# Patient Record
Sex: Female | Born: 1960 | Race: White | Hispanic: No | Marital: Married | Smoking: Never smoker
Health system: Southern US, Community
[De-identification: ages and names within clinical notes are randomized; demographics above are authoritative.]

## PROBLEM LIST (undated history)

## (undated) DIAGNOSIS — F429 Obsessive-compulsive disorder, unspecified: Secondary | ICD-10-CM

## (undated) DIAGNOSIS — D496 Neoplasm of unspecified behavior of brain: Secondary | ICD-10-CM

## (undated) DIAGNOSIS — G40909 Epilepsy, unspecified, not intractable, without status epilepticus: Secondary | ICD-10-CM

## (undated) HISTORY — DX: Epilepsy, unspecified, not intractable, without status epilepticus: G40.909

## (undated) HISTORY — PX: COSMETIC SURGERY: SHX468

## (undated) HISTORY — DX: Obsessive-compulsive disorder, unspecified: F42.9

---

## 1996-11-05 HISTORY — PX: STEREOTACTIC BRAIN BIOPSY: SUR134

## 2004-08-05 ENCOUNTER — Ambulatory Visit: Payer: Self-pay | Admitting: Internal Medicine

## 2004-09-05 ENCOUNTER — Ambulatory Visit: Payer: Self-pay | Admitting: Internal Medicine

## 2004-10-05 ENCOUNTER — Ambulatory Visit: Payer: Self-pay | Admitting: Internal Medicine

## 2004-11-05 ENCOUNTER — Ambulatory Visit: Payer: Self-pay | Admitting: Internal Medicine

## 2004-12-06 ENCOUNTER — Ambulatory Visit: Payer: Self-pay | Admitting: Internal Medicine

## 2005-10-24 ENCOUNTER — Emergency Department: Payer: Self-pay | Admitting: Emergency Medicine

## 2007-08-05 ENCOUNTER — Ambulatory Visit: Payer: Self-pay | Admitting: Specialist

## 2008-10-14 ENCOUNTER — Ambulatory Visit: Payer: Self-pay | Admitting: Obstetrics and Gynecology

## 2009-04-03 IMAGING — CT CT ABDOMEN AND PELVIS WITHOUT AND WITH CONTRAST
2 of 4 series · 14 of 32 positions shown, 19 images · non-contrast
Comparison: none

REASON FOR EXAM: chronic cystitis
COMMENTS:

[Series 4: with · axial · 0.58mm/px · z∈[-524,-194]mm · 8 of 86 slices shown, 13 images]
[im 10/86  soft-tissue]
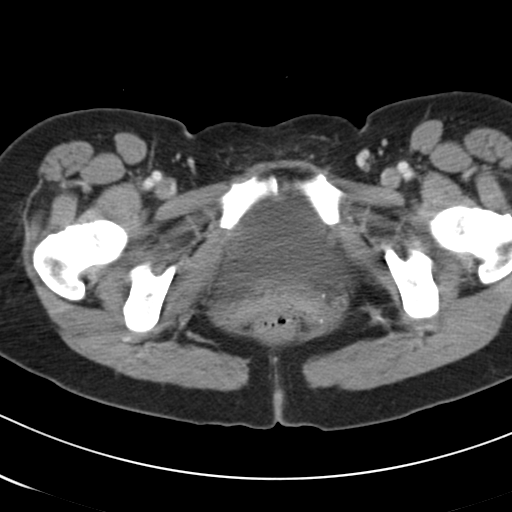
[im 10/86  bone]
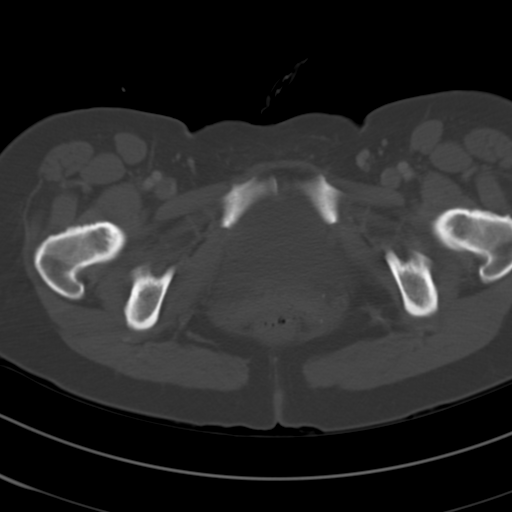
[im 19/86  soft-tissue]
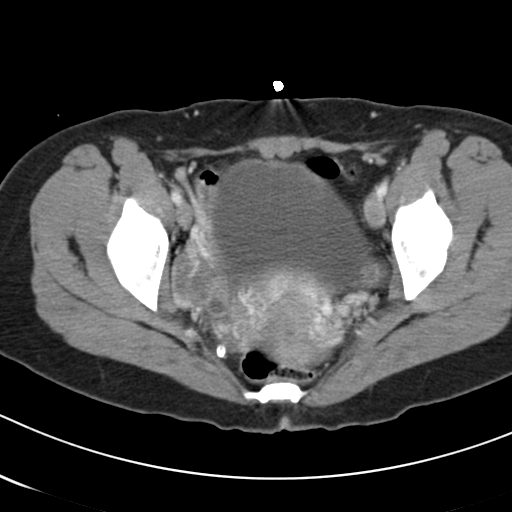
[im 29/86  soft-tissue]
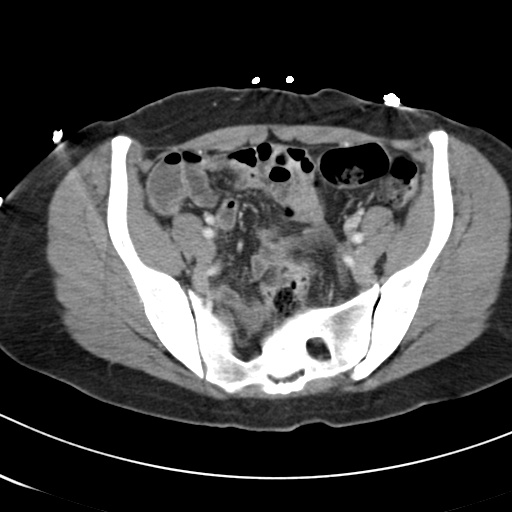
[im 38/86  soft-tissue]
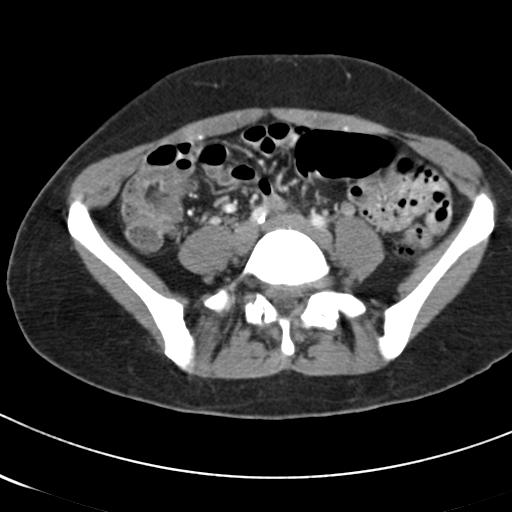
[im 48/86  soft-tissue]
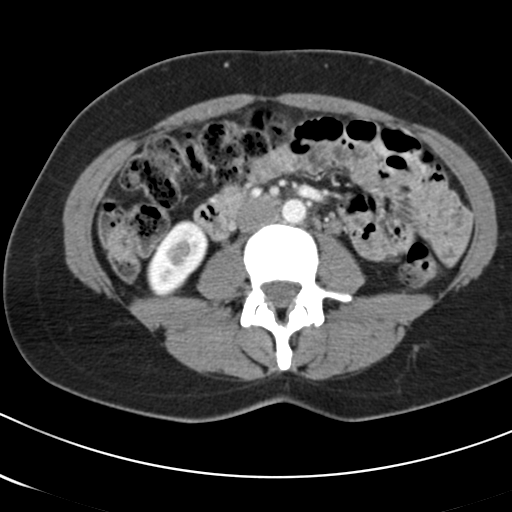
[im 48/86  lung]
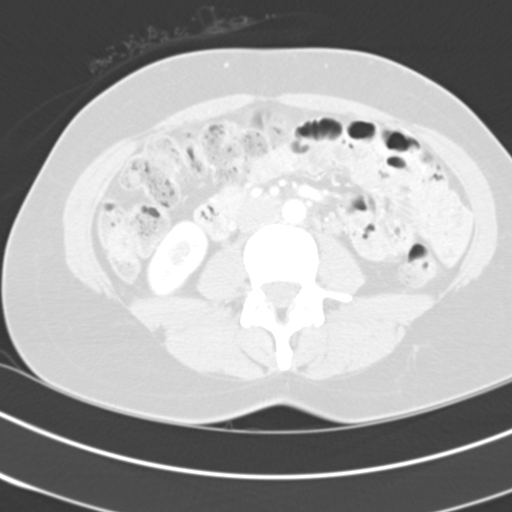
[im 57/86  soft-tissue]
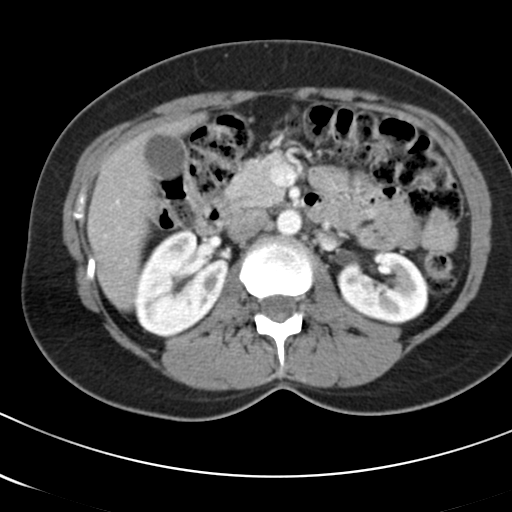
[im 57/86  lung]
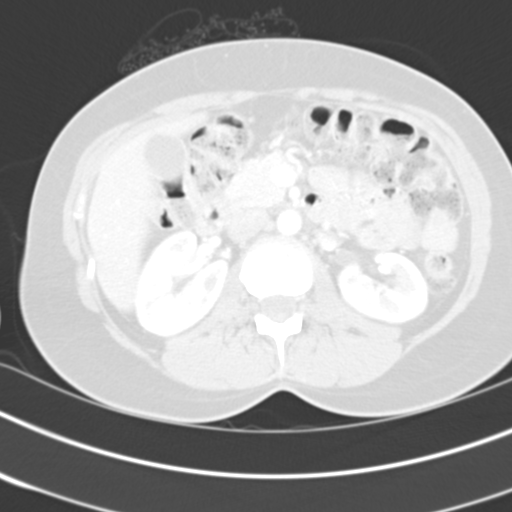
[im 67/86  soft-tissue]
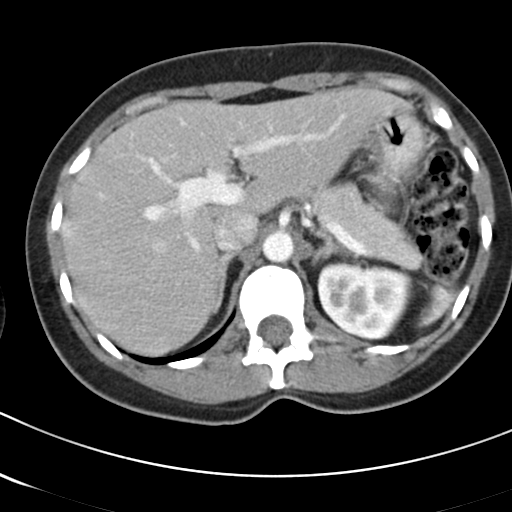
[im 67/86  lung]
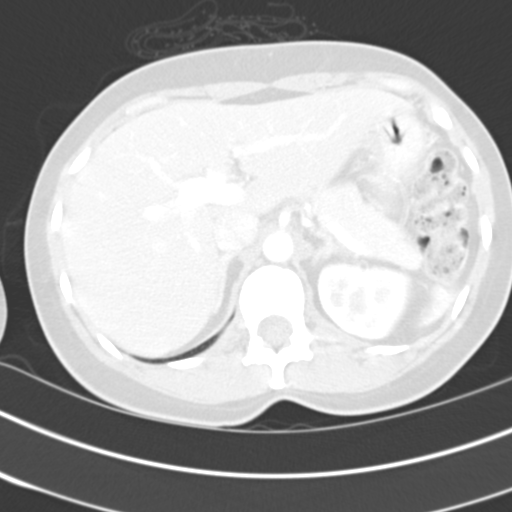
[im 76/86  soft-tissue]
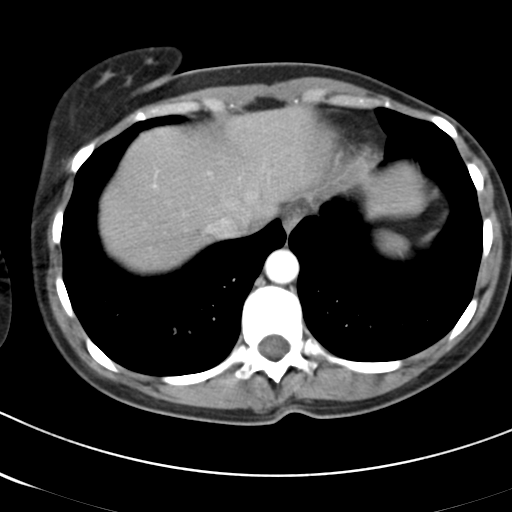
[im 76/86  lung]
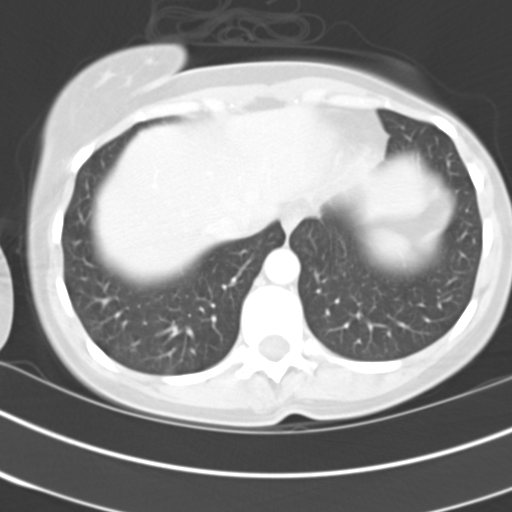

[Series 6: delay · axial · delayed · 0.58mm/px · z∈[-526,-290]mm · 6 of 86 slices shown]
[im 10/86  soft-tissue]
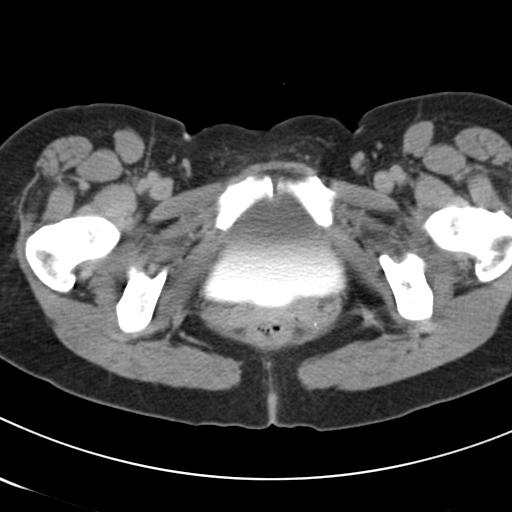
[im 19/86  soft-tissue]
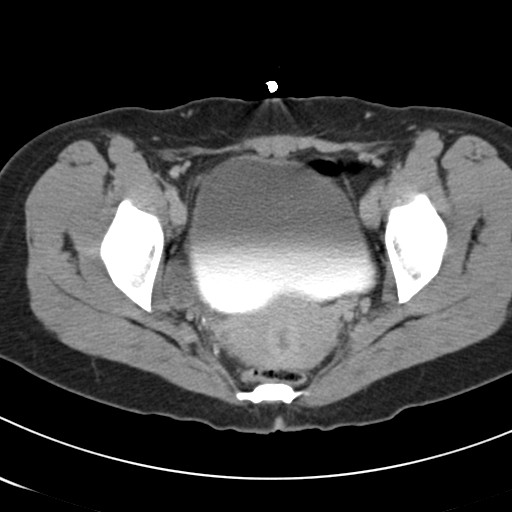
[im 29/86  soft-tissue]
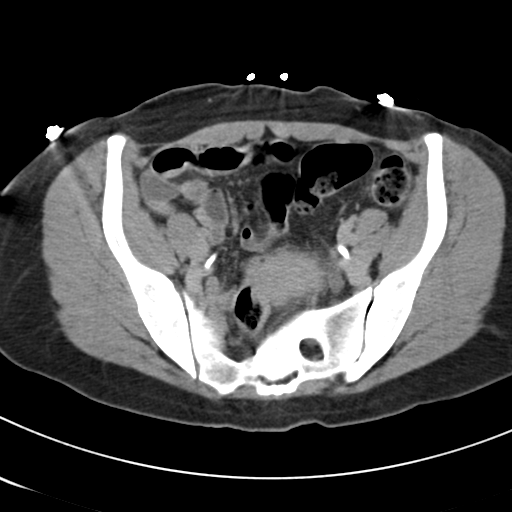
[im 38/86  soft-tissue]
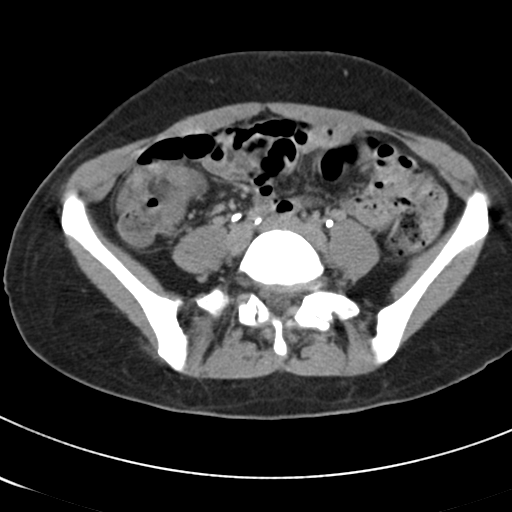
[im 48/86  soft-tissue]
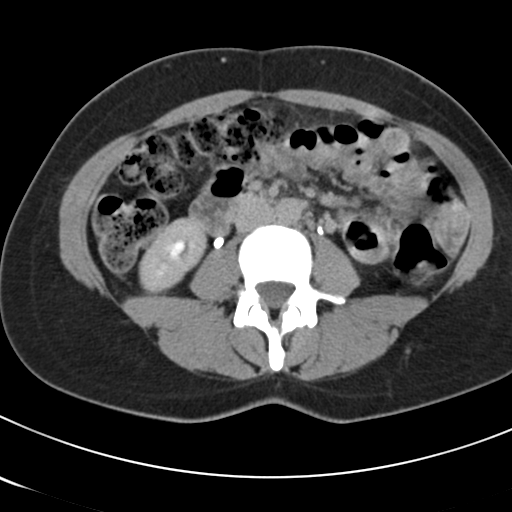
[im 57/86  soft-tissue]
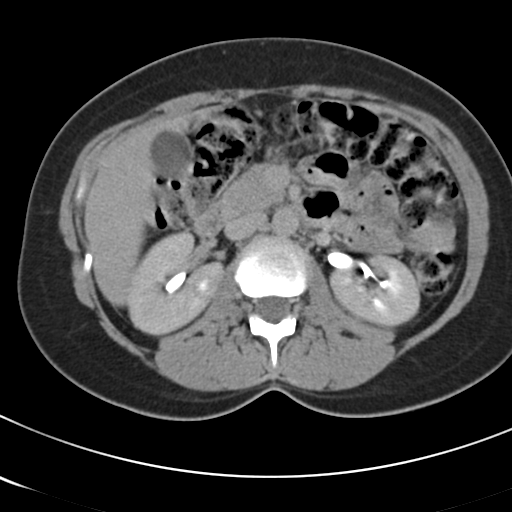

[14 of 32 positions shown; findings below may reference images not displayed]

PROCEDURE:     CT  - CT ABDOMEN / PELVIS  W/WO  - August 05, 2007  [DATE]

RESULT:     Helical 5 mm sections were obtained from the lung bases through
the superior iliac crest pre, immediate and delay intravenous administration
of 100 ml of [id number].

Evaluation of the lung bases demonstrates no gross abnormalities. Evaluation
of the RIGHT and LEFT kidneys demonstrates no evidence of renal masses or
hydronephrosis. There appears to be small bilateral extrarenal pelves. The
liver, spleen, adrenals and pancreas are unremarkable. The portal vein, SMV,
celiac, SMA are opacified. There is no evidence of an abdominal aortic
aneurysm. No evidence of abdominal or pelvic free fluid, drainable loculated
fluid collections, masses or adenopathy is appreciated. Incidental note is
made of a metallic density within the endometrial canal likely representing
an intrauterine device. The urinary bladder is distended with urine and
partially distended with contrast. The urinary bladder is grossly
unremarkable.
IMPRESSION: 1)No CT evidence of abdominal or pelvic masses, free fluid or drainable
loculated fluid collections.  Bilateral mild extrarenal pelves, otherwise,
no further evidence of renal abnormalities.

## 2009-11-02 ENCOUNTER — Ambulatory Visit: Payer: Self-pay | Admitting: Obstetrics and Gynecology

## 2010-11-27 ENCOUNTER — Ambulatory Visit: Payer: Self-pay | Admitting: Obstetrics and Gynecology

## 2011-11-20 LAB — HM PAP SMEAR: HM Pap smear: NORMAL

## 2012-02-05 ENCOUNTER — Ambulatory Visit: Payer: Self-pay | Admitting: Obstetrics and Gynecology

## 2012-02-07 ENCOUNTER — Ambulatory Visit: Payer: Self-pay | Admitting: Obstetrics and Gynecology

## 2012-12-24 ENCOUNTER — Other Ambulatory Visit: Payer: Self-pay

## 2012-12-24 LAB — COMPREHENSIVE METABOLIC PANEL
Albumin: 4.2 g/dL (ref 3.4–5.0)
Alkaline Phosphatase: 77 U/L (ref 50–136)
BUN: 9 mg/dL (ref 7–18)
Calcium, Total: 9.2 mg/dL (ref 8.5–10.1)
Chloride: 102 mmol/L (ref 98–107)
EGFR (African American): >60
EGFR (Non-African Amer.): >60
Glucose: 99 mg/dL (ref 65–99)
Potassium: 3.8 mmol/L (ref 3.5–5.1)
SGOT(AST): 22 U/L (ref 15–37)
SGPT (ALT): 26 U/L (ref 12–78)

## 2012-12-24 LAB — CBC WITH DIFFERENTIAL/PLATELET
Basophil #: 0 10*3/uL (ref 0.0–0.1)
Eosinophil #: 0 10*3/uL (ref 0.0–0.7)
HCT: 39.3 % (ref 35.0–47.0)
Lymphocyte #: 1 10*3/uL (ref 1.0–3.6)
Lymphocyte %: 23.6 %
MCH: 32 pg (ref 26.0–34.0)
MCHC: 33.6 g/dL (ref 32.0–36.0)
MCV: 95 fL (ref 80–100)
Monocyte %: 4.7 %
Platelet: 256 10*3/uL (ref 150–440)
RBC: 4.13 10*6/uL (ref 3.80–5.20)
RDW: 12.4 % (ref 11.5–14.5)
WBC: 4.3 10*3/uL (ref 3.6–11.0)

## 2013-01-03 ENCOUNTER — Other Ambulatory Visit: Payer: Self-pay

## 2013-01-07 LAB — CBC WITH DIFFERENTIAL/PLATELET
Basophil #: 0 10*3/uL (ref 0.0–0.1)
Basophil %: 0.7 %
Eosinophil #: 0 10*3/uL (ref 0.0–0.7)
Eosinophil %: 0.6 %
Lymphocyte #: 0.8 10*3/uL — ABNORMAL LOW (ref 1.0–3.6)
Lymphocyte %: 21.4 %
MCH: 32.5 pg (ref 26.0–34.0)
MCHC: 33.8 g/dL (ref 32.0–36.0)
MCV: 96 fL (ref 80–100)
Monocyte %: 9 %
Neutrophil #: 2.7 10*3/uL (ref 1.4–6.5)
Platelet: 143 10*3/uL — ABNORMAL LOW (ref 150–440)

## 2013-01-07 LAB — COMPREHENSIVE METABOLIC PANEL
Alkaline Phosphatase: 81 U/L (ref 50–136)
Anion Gap: 4 — ABNORMAL LOW (ref 7–16)
BUN: 7 mg/dL (ref 7–18)
Bilirubin,Total: 0.3 mg/dL (ref 0.2–1.0)
Co2: 32 mmol/L (ref 21–32)
EGFR (African American): >60
EGFR (Non-African Amer.): >60
Osmolality: 279 (ref 275–301)
Potassium: 4 mmol/L (ref 3.5–5.1)
SGOT(AST): 18 U/L (ref 15–37)
SGPT (ALT): 21 U/L (ref 12–78)
Sodium: 141 mmol/L (ref 136–145)

## 2013-01-14 LAB — CBC WITH DIFFERENTIAL/PLATELET
Eosinophil %: 0.4 %
MCHC: 31.9 g/dL — ABNORMAL LOW (ref 32.0–36.0)
MCV: 97 fL (ref 80–100)
Monocyte #: 0.2 x10 3/mm (ref 0.2–0.9)
Monocyte %: 5.3 %
Neutrophil #: 2.8 10*3/uL (ref 1.4–6.5)
Neutrophil %: 73.2 %
RDW: 13.9 % (ref 11.5–14.5)
WBC: 3.9 10*3/uL (ref 3.6–11.0)

## 2013-01-21 LAB — CBC WITH DIFFERENTIAL/PLATELET
Basophil #: 0 10*3/uL (ref 0.0–0.1)
Basophil %: 0.9 %
Eosinophil #: 0 10*3/uL (ref 0.0–0.7)
HCT: 38.9 % (ref 35.0–47.0)
HGB: 12.6 g/dL (ref 12.0–16.0)
Lymphocyte #: 0.9 10*3/uL — ABNORMAL LOW (ref 1.0–3.6)
Lymphocyte %: 24.6 %
MCH: 31.9 pg (ref 26.0–34.0)
MCV: 99 fL (ref 80–100)
Monocyte #: 0.2 x10 3/mm (ref 0.2–0.9)
Monocyte %: 5 %
Platelet: 208 10*3/uL (ref 150–440)
RBC: 3.95 10*6/uL (ref 3.80–5.20)
RDW: 14.6 % — ABNORMAL HIGH (ref 11.5–14.5)
WBC: 3.6 10*3/uL (ref 3.6–11.0)

## 2013-01-21 LAB — COMPREHENSIVE METABOLIC PANEL
Alkaline Phosphatase: 78 U/L (ref 50–136)
Anion Gap: 4 — ABNORMAL LOW (ref 7–16)
BUN: 11 mg/dL (ref 7–18)
Chloride: 104 mmol/L (ref 98–107)
Creatinine: 0.55 mg/dL — ABNORMAL LOW (ref 0.60–1.30)
EGFR (Non-African Amer.): >60
Osmolality: 278 (ref 275–301)
Potassium: 4 mmol/L (ref 3.5–5.1)
SGOT(AST): 21 U/L (ref 15–37)
SGPT (ALT): 21 U/L (ref 12–78)
Total Protein: 7.3 g/dL (ref 6.4–8.2)

## 2013-01-23 ENCOUNTER — Ambulatory Visit (INDEPENDENT_AMBULATORY_CARE_PROVIDER_SITE_OTHER): Payer: Medicare Other | Admitting: Internal Medicine

## 2013-01-23 ENCOUNTER — Encounter: Payer: Self-pay | Admitting: Internal Medicine

## 2013-01-23 VITALS — BP 116/74 | HR 73 | Temp 97.4°F | Resp 16 | Ht 63.0 in | Wt 109.2 lb

## 2013-01-23 DIAGNOSIS — G40309 Generalized idiopathic epilepsy and epileptic syndromes, not intractable, without status epilepticus: Secondary | ICD-10-CM

## 2013-01-23 DIAGNOSIS — F429 Obsessive-compulsive disorder, unspecified: Secondary | ICD-10-CM

## 2013-01-23 DIAGNOSIS — Z87442 Personal history of urinary calculi: Secondary | ICD-10-CM

## 2013-01-23 DIAGNOSIS — N390 Urinary tract infection, site not specified: Secondary | ICD-10-CM

## 2013-01-23 DIAGNOSIS — R0789 Other chest pain: Secondary | ICD-10-CM

## 2013-01-23 DIAGNOSIS — D496 Neoplasm of unspecified behavior of brain: Secondary | ICD-10-CM

## 2013-01-23 DIAGNOSIS — Z1239 Encounter for other screening for malignant neoplasm of breast: Secondary | ICD-10-CM
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
## 2013-01-24 ENCOUNTER — Encounter: Payer: Self-pay | Admitting: Internal Medicine

## 2013-01-24 DIAGNOSIS — G40909 Epilepsy, unspecified, not intractable, without status epilepticus: Secondary | ICD-10-CM | POA: Insufficient documentation

## 2013-01-24 DIAGNOSIS — C719 Malignant neoplasm of brain, unspecified: Secondary | ICD-10-CM | POA: Insufficient documentation

## 2013-01-24 DIAGNOSIS — F429 Obsessive-compulsive disorder, unspecified: Secondary | ICD-10-CM | POA: Insufficient documentation

## 2013-01-24 DIAGNOSIS — Z87442 Personal history of urinary calculi: Secondary | ICD-10-CM | POA: Insufficient documentation

## 2013-01-24 DIAGNOSIS — R0789 Other chest pain: Secondary | ICD-10-CM | POA: Insufficient documentation

## 2013-01-24 DIAGNOSIS — G40309 Generalized idiopathic epilepsy and epileptic syndromes, not intractable, without status epilepticus: Secondary | ICD-10-CM | POA: Insufficient documentation

## 2013-01-24 DIAGNOSIS — N390 Urinary tract infection, site not specified: Secondary | ICD-10-CM | POA: Insufficient documentation

## 2013-01-24 NOTE — Assessment & Plan Note (Signed)
Repeat cardiology evaluation at [facility] for atypical pain reassured patient that CAD was unlikely despite her maternal history of  Early CAD.

## 2013-01-24 NOTE — Assessment & Plan Note (Signed)
secondarty to parietal tumor,  With frequent weekly occurrences.

## 2013-01-24 NOTE — Assessment & Plan Note (Addendum)
Diagnosis age 52 she with recurrence under treatment with CEENU (chemotherapy .  She is debilitated for several days following dosing but eventually recovers. Recent MRI suggests progression of disease as a new lesion has been noted.  She has significant short-term memory problems and episodic sensory changes that are similar to hot flashes that are worrisome to her . her speech is fluent and articulate. He has a normal gait and normal cerebellar reflexes .Records requested.

## 2013-01-24 NOTE — Assessment & Plan Note (Signed)
By review of office notes of her [facility] psychotherapist.

## 2013-02-03 ENCOUNTER — Other Ambulatory Visit: Payer: Self-pay

## 2013-02-04 LAB — COMPREHENSIVE METABOLIC PANEL
Albumin: 4 g/dL (ref 3.4–5.0)
BUN: 11 mg/dL (ref 7–18)
Bilirubin,Total: 0.3 mg/dL (ref 0.2–1.0)
Calcium, Total: 8.6 mg/dL (ref 8.5–10.1)
Co2: 27 mmol/L (ref 21–32)
Creatinine: 0.52 mg/dL — ABNORMAL LOW (ref 0.60–1.30)
Glucose: 87 mg/dL (ref 65–99)
Osmolality: 274 (ref 275–301)
SGOT(AST): 18 U/L (ref 15–37)
SGPT (ALT): 20 U/L (ref 12–78)
Sodium: 138 mmol/L (ref 136–145)
Total Protein: 7.3 g/dL (ref 6.4–8.2)

## 2013-02-04 LAB — CBC WITH DIFFERENTIAL/PLATELET
Basophil #: 0 10*3/uL (ref 0.0–0.1)
HGB: 12.4 g/dL (ref 12.0–16.0)
Lymphocyte #: 0.7 10*3/uL — ABNORMAL LOW (ref 1.0–3.6)
Lymphocyte %: 23.6 %
MCHC: 33.8 g/dL (ref 32.0–36.0)
MCV: 100 fL (ref 80–100)
Monocyte #: 0.2 x10 3/mm (ref 0.2–0.9)
Monocyte %: 7.1 %
Neutrophil #: 2 10*3/uL (ref 1.4–6.5)
Neutrophil %: 68.1 %
RBC: 3.69 10*6/uL — ABNORMAL LOW (ref 3.80–5.20)
RDW: 15.8 % — ABNORMAL HIGH (ref 11.5–14.5)
WBC: 3 10*3/uL — ABNORMAL LOW (ref 3.6–11.0)

## 2013-02-16 ENCOUNTER — Other Ambulatory Visit: Payer: Self-pay | Admitting: Internal Medicine

## 2013-02-16 LAB — CBC WITH DIFFERENTIAL/PLATELET
Basophil #: 0 10*3/uL (ref 0.0–0.1)
Eosinophil #: 0 10*3/uL (ref 0.0–0.7)
HCT: 37 % (ref 35.0–47.0)
HGB: 12.3 g/dL (ref 12.0–16.0)
Lymphocyte #: 0.7 10*3/uL — ABNORMAL LOW (ref 1.0–3.6)
MCHC: 33.2 g/dL (ref 32.0–36.0)
MCV: 101 fL — ABNORMAL HIGH (ref 80–100)
Monocyte %: 5.4 %
Platelet: 264 10*3/uL (ref 150–440)
RBC: 3.66 10*6/uL — ABNORMAL LOW (ref 3.80–5.20)

## 2013-02-16 LAB — COMPREHENSIVE METABOLIC PANEL
Albumin: 3.7 g/dL (ref 3.4–5.0)
Alkaline Phosphatase: 74 U/L (ref 50–136)
BUN: 12 mg/dL (ref 7–18)
Bilirubin,Total: 0.2 mg/dL (ref 0.2–1.0)
Calcium, Total: 8.8 mg/dL (ref 8.5–10.1)
Creatinine: 0.47 mg/dL — ABNORMAL LOW (ref 0.60–1.30)
EGFR (African American): >60
EGFR (Non-African Amer.): >60
SGOT(AST): 21 U/L (ref 15–37)
SGPT (ALT): 19 U/L (ref 12–78)
Total Protein: 6.7 g/dL (ref 6.4–8.2)

## 2013-02-16 LAB — LIPID PANEL
HDL Cholesterol: 80 mg/dL — ABNORMAL HIGH (ref 40–60)
Ldl Cholesterol, Calc: 148 mg/dL — ABNORMAL HIGH (ref 0–100)
Triglycerides: 117 mg/dL (ref 0–200)
VLDL Cholesterol, Calc: 23 mg/dL (ref 5–40)

## 2013-02-16 LAB — TSH: Thyroid Stimulating Horm: 1.85 u[IU]/mL

## 2013-02-17 ENCOUNTER — Telehealth: Payer: Self-pay | Admitting: Internal Medicine

## 2013-02-17 NOTE — Telephone Encounter (Signed)
Cholesterol and thyroid function are normal

## 2013-02-17 NOTE — Telephone Encounter (Signed)
Patient notified as requested by phone.

## 2013-02-18 ENCOUNTER — Telehealth: Payer: Self-pay | Admitting: Internal Medicine

## 2013-02-23 ENCOUNTER — Ambulatory Visit (INDEPENDENT_AMBULATORY_CARE_PROVIDER_SITE_OTHER): Payer: Medicare Other | Admitting: Internal Medicine

## 2013-02-23 ENCOUNTER — Encounter: Payer: Self-pay | Admitting: Internal Medicine

## 2013-02-23 VITALS — BP 102/62 | HR 66 | Temp 97.8°F | Resp 16 | Wt 113.8 lb

## 2013-02-23 DIAGNOSIS — G40909 Epilepsy, unspecified, not intractable, without status epilepticus: Secondary | ICD-10-CM

## 2013-02-23 DIAGNOSIS — F429 Obsessive-compulsive disorder, unspecified: Secondary | ICD-10-CM

## 2013-02-23 DIAGNOSIS — G40309 Generalized idiopathic epilepsy and epileptic syndromes, not intractable, without status epilepticus: Secondary | ICD-10-CM

## 2013-02-23 LAB — CBC WITH DIFFERENTIAL/PLATELET
Eosinophil #: 0 10*3/uL (ref 0.0–0.7)
Eosinophil %: 0.2 %
HCT: 36.3 % (ref 35.0–47.0)
HGB: 12.4 g/dL (ref 12.0–16.0)
Lymphocyte #: 0.8 10*3/uL — ABNORMAL LOW (ref 1.0–3.6)
Lymphocyte %: 19.5 %
MCH: 34.5 pg — ABNORMAL HIGH (ref 26.0–34.0)
MCHC: 34.2 g/dL (ref 32.0–36.0)
Monocyte #: 0.3 x10 3/mm (ref 0.2–0.9)
Neutrophil %: 72.7 %
RBC: 3.6 10*6/uL — ABNORMAL LOW (ref 3.80–5.20)
WBC: 4.2 10*3/uL (ref 3.6–11.0)
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
## 2013-02-23 NOTE — Patient Instructions (Addendum)
You are doing great!  Return in 3 months ,  Sooner if you need to.  Let me know if renee recommends an alternative medication to manage your anxiety

## 2013-02-24 ENCOUNTER — Encounter: Payer: Self-pay | Admitting: Internal Medicine

## 2013-02-24 NOTE — Assessment & Plan Note (Signed)
currently managed with celexa and ativan. No changes today.

## 2013-02-24 NOTE — Assessment & Plan Note (Signed)
She does not drive due to frequent recurrences

## 2013-02-24 NOTE — Assessment & Plan Note (Signed)
Recurrent, with significant prodromal period secondary to brain tumor.

## 2013-03-02 LAB — CBC WITH DIFFERENTIAL/PLATELET
Basophil #: 0 x10 3/mm 3 (ref 0.0–0.1)
Basophil %: 0.8 %
Eosinophil #: 0 x10 3/mm 3 (ref 0.0–0.7)
Eosinophil %: 0.3 %
HCT: 36.6 % (ref 35.0–47.0)
HGB: 12.4 g/dL (ref 12.0–16.0)
Lymphocyte %: 18.2 %
Lymphs Abs: 0.9 x10 3/mm 3 — ABNORMAL LOW (ref 1.0–3.6)
MCH: 34.1 pg — ABNORMAL HIGH (ref 26.0–34.0)
MCHC: 33.9 g/dL (ref 32.0–36.0)
MCV: 101 fL — ABNORMAL HIGH (ref 80–100)
Monocyte #: 0.3 x10 3/mm (ref 0.2–0.9)
Monocyte %: 5.8 %
Neutrophil #: 3.7 x10 3/mm 3 (ref 1.4–6.5)
Neutrophil %: 74.9 %
Platelet: 183 x10 3/mm 3 (ref 150–440)
RBC: 3.64 X10 6/mm 3 — ABNORMAL LOW (ref 3.80–5.20)
RDW: 16.1 % — ABNORMAL HIGH (ref 11.5–14.5)
WBC: 4.9 x10 3/mm 3 (ref 3.6–11.0)

## 2013-03-02 LAB — COMPREHENSIVE METABOLIC PANEL
Alkaline Phosphatase: 71 U/L (ref 50–136)
BUN: 8 mg/dL (ref 7–18)
Bilirubin,Total: 0.3 mg/dL (ref 0.2–1.0)
Calcium, Total: 9 mg/dL (ref 8.5–10.1)
Creatinine: 0.61 mg/dL (ref 0.60–1.30)
EGFR (African American): >60
Glucose: 93 mg/dL (ref 65–99)
Osmolality: 277 (ref 275–301)
Potassium: 3.9 mmol/L (ref 3.5–5.1)
Sodium: 140 mmol/L (ref 136–145)
Total Protein: 6.9 g/dL (ref 6.4–8.2)

## 2013-03-03 ENCOUNTER — Telehealth: Payer: Self-pay | Admitting: Internal Medicine

## 2013-03-03 NOTE — Telephone Encounter (Signed)
[facility] labs in box °

## 2013-03-03 NOTE — Telephone Encounter (Signed)
Labs in yellow folder.

## 2013-03-05 ENCOUNTER — Telehealth: Payer: Self-pay | Admitting: Internal Medicine

## 2013-03-05 ENCOUNTER — Other Ambulatory Visit: Payer: Self-pay

## 2013-03-05 NOTE — Telephone Encounter (Signed)
Called patient left detailed message on voicemail

## 2013-03-05 NOTE — Telephone Encounter (Signed)
All labs normal,. Including CBC and liver/kidney function

## 2013-03-06 ENCOUNTER — Encounter: Payer: Self-pay | Admitting: Internal Medicine

## 2013-03-10 LAB — CBC WITH DIFFERENTIAL/PLATELET
Basophil #: 0 10*3/uL (ref 0.0–0.1)
Basophil %: 0.7 %
Eosinophil %: 1.3 %
HCT: 37.9 % (ref 35.0–47.0)
HGB: 13 g/dL (ref 12.0–16.0)
MCH: 34.3 pg — ABNORMAL HIGH (ref 26.0–34.0)
MCHC: 34.3 g/dL (ref 32.0–36.0)
Monocyte #: 0.3 x10 3/mm (ref 0.2–0.9)
Neutrophil #: 2.9 10*3/uL (ref 1.4–6.5)
Neutrophil %: 70.4 %
Platelet: 157 10*3/uL (ref 150–440)
RDW: 15.1 % — ABNORMAL HIGH (ref 11.5–14.5)

## 2013-03-10 LAB — COMPREHENSIVE METABOLIC PANEL
Albumin: 4.1 g/dL (ref 3.4–5.0)
Anion Gap: 4 — ABNORMAL LOW (ref 7–16)
Bilirubin,Total: 0.3 mg/dL (ref 0.2–1.0)
Chloride: 105 mmol/L (ref 98–107)
Co2: 30 mmol/L (ref 21–32)
EGFR (Non-African Amer.): >60
Glucose: 78 mg/dL (ref 65–99)
Potassium: 4 mmol/L (ref 3.5–5.1)
SGOT(AST): 25 U/L (ref 15–37)
SGPT (ALT): 19 U/L (ref 12–78)
Total Protein: 7.3 g/dL (ref 6.4–8.2)

## 2013-04-05 ENCOUNTER — Other Ambulatory Visit: Payer: Self-pay

## 2013-04-07 LAB — COMPREHENSIVE METABOLIC PANEL
Albumin: 3.9 g/dL (ref 3.4–5.0)
Anion Gap: 3 — ABNORMAL LOW (ref 7–16)
Bilirubin,Total: 0.3 mg/dL (ref 0.2–1.0)
Calcium, Total: 8.8 mg/dL (ref 8.5–10.1)
Chloride: 102 mmol/L (ref 98–107)
EGFR (African American): >60
EGFR (Non-African Amer.): >60
Osmolality: 275 (ref 275–301)
Potassium: 3.7 mmol/L (ref 3.5–5.1)
SGOT(AST): 16 U/L (ref 15–37)
Total Protein: 7.1 g/dL (ref 6.4–8.2)

## 2013-04-07 LAB — CBC WITH DIFFERENTIAL/PLATELET
Basophil #: 0 10*3/uL (ref 0.0–0.1)
Basophil %: 0.6 %
HCT: 37.5 % (ref 35.0–47.0)
HGB: 12.8 g/dL (ref 12.0–16.0)
Lymphocyte #: 0.7 10*3/uL — ABNORMAL LOW (ref 1.0–3.6)
Lymphocyte %: 20.4 %
MCH: 34.8 pg — ABNORMAL HIGH (ref 26.0–34.0)
MCV: 102 fL — ABNORMAL HIGH (ref 80–100)
Monocyte #: 0.2 x10 3/mm (ref 0.2–0.9)
Monocyte %: 5.2 %
Neutrophil #: 2.6 10*3/uL (ref 1.4–6.5)
Neutrophil %: 72.7 %
RBC: 3.67 10*6/uL — ABNORMAL LOW (ref 3.80–5.20)
RDW: 13 % (ref 11.5–14.5)

## 2013-04-16 ENCOUNTER — Encounter: Payer: Self-pay | Admitting: Internal Medicine

## 2013-04-23 LAB — COMPREHENSIVE METABOLIC PANEL
Albumin: 3.9 g/dL (ref 3.4–5.0)
BUN: 7 mg/dL (ref 7–18)
Bilirubin,Total: 0.3 mg/dL (ref 0.2–1.0)
Co2: 32 mmol/L (ref 21–32)
EGFR (African American): >60
EGFR (Non-African Amer.): >60
Glucose: 99 mg/dL (ref 65–99)
Osmolality: 279 (ref 275–301)
Potassium: 3.7 mmol/L (ref 3.5–5.1)
Sodium: 141 mmol/L (ref 136–145)
Total Protein: 6.9 g/dL (ref 6.4–8.2)

## 2013-04-23 LAB — CBC WITH DIFFERENTIAL/PLATELET
Eosinophil %: 0.9 %
HCT: 37 % (ref 35.0–47.0)
HGB: 12.4 g/dL (ref 12.0–16.0)
Neutrophil %: 69 %
RBC: 3.66 10*6/uL — ABNORMAL LOW (ref 3.80–5.20)

## 2013-04-25 ENCOUNTER — Ambulatory Visit (INDEPENDENT_AMBULATORY_CARE_PROVIDER_SITE_OTHER): Payer: Medicare Other | Admitting: Family Medicine

## 2013-04-25 VITALS — BP 106/68 | HR 70 | Temp 97.8°F | Wt 113.0 lb

## 2013-04-25 DIAGNOSIS — H9190 Unspecified hearing loss, unspecified ear: Secondary | ICD-10-CM

## 2013-04-25 NOTE — Patient Instructions (Addendum)
It was great to meet you. You have a lot of wax in your right ear.   Try Debrox over the counter. Please call your on call neurologist and Dr. Darrick Huntsman on Monday.
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
## 2013-05-01 ENCOUNTER — Ambulatory Visit: Payer: BC Managed Care – PPO | Admitting: Internal Medicine

## 2013-05-05 ENCOUNTER — Other Ambulatory Visit: Payer: Self-pay

## 2013-05-19 LAB — COMPREHENSIVE METABOLIC PANEL
Alkaline Phosphatase: 88 U/L (ref 50–136)
BUN: 6 mg/dL — ABNORMAL LOW (ref 7–18)
Calcium, Total: 8.8 mg/dL (ref 8.5–10.1)
Chloride: 105 mmol/L (ref 98–107)
EGFR (African American): >60
EGFR (Non-African Amer.): >60
Osmolality: 273 (ref 275–301)
Potassium: 4.1 mmol/L (ref 3.5–5.1)

## 2013-05-19 LAB — CBC WITH DIFFERENTIAL/PLATELET
Basophil #: 0 10*3/uL (ref 0.0–0.1)
Basophil %: 0.5 %
Eosinophil %: 1.3 %
HGB: 12.2 g/dL (ref 12.0–16.0)
Lymphocyte %: 17.2 %
MCH: 32.5 pg (ref 26.0–34.0)
MCHC: 32.4 g/dL (ref 32.0–36.0)
Monocyte #: 0.2 x10 3/mm (ref 0.2–0.9)
Monocyte %: 6.3 %
Neutrophil %: 74.7 %
RDW: 12.4 % (ref 11.5–14.5)

## 2013-05-25 ENCOUNTER — Encounter: Payer: Self-pay | Admitting: Internal Medicine

## 2013-05-25 ENCOUNTER — Ambulatory Visit (INDEPENDENT_AMBULATORY_CARE_PROVIDER_SITE_OTHER): Payer: Medicare Other | Admitting: Internal Medicine

## 2013-05-25 VITALS — BP 106/58 | HR 78 | Temp 98.4°F | Resp 14 | Wt 110.2 lb

## 2013-05-25 DIAGNOSIS — F5231 Female orgasmic disorder: Secondary | ICD-10-CM | POA: Insufficient documentation

## 2013-05-25 DIAGNOSIS — H918X9 Other specified hearing loss, unspecified ear: Secondary | ICD-10-CM

## 2013-05-25 DIAGNOSIS — H6120 Impacted cerumen, unspecified ear: Secondary | ICD-10-CM | POA: Insufficient documentation

## 2013-05-25 NOTE — Patient Instructions (Addendum)
Your inability to achieve orgasm is likely medication related but may be complicated by vaginal dryness due to the onset of  menopause  You can try [place] personal lubricant (it comes in a suppository form) or Astroglide (a gel)   Do not pressure yourself to achieve orgasm in fro of your husband!! (Practice on your own when you do not have any company)   If these do not help, ask your oncologist about using vaginal estrogen

## 2013-05-25 NOTE — Assessment & Plan Note (Signed)
Both ears were removed of impaction today with resolution of hearing loss.  Advised to use debrox once or twice weekly.

## 2013-05-25 NOTE — Assessment & Plan Note (Signed)
Medication induced.  Vaginal dryness also cited as contributing.  Discussed vaginal lubricants and vaginal estrogen which would need to be discussed her her oncologist.
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
## 2013-06-01 LAB — COMPREHENSIVE METABOLIC PANEL
Albumin: 3.9 g/dL (ref 3.4–5.0)
Alkaline Phosphatase: 87 U/L (ref 50–136)
Anion Gap: 5 — ABNORMAL LOW (ref 7–16)
Bilirubin,Total: 0.4 mg/dL (ref 0.2–1.0)
Calcium, Total: 9.2 mg/dL (ref 8.5–10.1)
Chloride: 104 mmol/L (ref 98–107)
Creatinine: 0.65 mg/dL (ref 0.60–1.30)
EGFR (African American): >60
EGFR (Non-African Amer.): >60
Potassium: 4 mmol/L (ref 3.5–5.1)
SGOT(AST): 19 U/L (ref 15–37)
Sodium: 139 mmol/L (ref 136–145)
Total Protein: 7.1 g/dL (ref 6.4–8.2)

## 2013-06-01 LAB — CBC WITH DIFFERENTIAL/PLATELET
Basophil %: 0.7 %
HGB: 12.7 g/dL (ref 12.0–16.0)
Lymphocyte #: 0.7 10*3/uL — ABNORMAL LOW (ref 1.0–3.6)
Lymphocyte %: 18.6 %
MCV: 99 fL (ref 80–100)
Monocyte #: 0.3 x10 3/mm (ref 0.2–0.9)
Monocyte %: 7.4 %
Neutrophil #: 2.7 10*3/uL (ref 1.4–6.5)
Neutrophil %: 72.9 %
WBC: 3.6 10*3/uL (ref 3.6–11.0)

## 2013-06-05 ENCOUNTER — Other Ambulatory Visit: Payer: Self-pay

## 2013-06-11 LAB — CBC WITH DIFFERENTIAL/PLATELET
Basophil %: 0.7 %
Eosinophil #: 0 10*3/uL (ref 0.0–0.7)
Eosinophil %: 0.6 %
HCT: 39.6 % (ref 35.0–47.0)
HGB: 13.7 g/dL (ref 12.0–16.0)
Lymphocyte #: 0.8 10*3/uL — ABNORMAL LOW (ref 1.0–3.6)
Lymphocyte %: 19.6 %
MCHC: 34.6 g/dL (ref 32.0–36.0)
MCV: 99 fL (ref 80–100)
Platelet: 177 10*3/uL (ref 150–440)
RBC: 4.01 10*6/uL (ref 3.80–5.20)

## 2013-06-11 LAB — COMPREHENSIVE METABOLIC PANEL
Albumin: 4 g/dL (ref 3.4–5.0)
Alkaline Phosphatase: 90 U/L (ref 50–136)
Anion Gap: 5 — ABNORMAL LOW (ref 7–16)
Bilirubin,Total: 0.3 mg/dL (ref 0.2–1.0)
Calcium, Total: 9.2 mg/dL (ref 8.5–10.1)
Chloride: 102 mmol/L (ref 98–107)
Co2: 32 mmol/L (ref 21–32)
EGFR (African American): >60
Glucose: 133 mg/dL — ABNORMAL HIGH (ref 65–99)
Osmolality: 277 (ref 275–301)
Potassium: 4 mmol/L (ref 3.5–5.1)
SGOT(AST): 18 U/L (ref 15–37)
SGPT (ALT): 16 U/L (ref 12–78)
Sodium: 139 mmol/L (ref 136–145)
Total Protein: 7.1 g/dL (ref 6.4–8.2)

## 2013-06-26 LAB — COMPREHENSIVE METABOLIC PANEL
Anion Gap: 5 — ABNORMAL LOW (ref 7–16)
BUN: 10 mg/dL (ref 7–18)
Bilirubin,Total: 0.5 mg/dL (ref 0.2–1.0)
Calcium, Total: 9.2 mg/dL (ref 8.5–10.1)
Co2: 29 mmol/L (ref 21–32)
EGFR (African American): >60
Glucose: 95 mg/dL (ref 65–99)
Osmolality: 271 (ref 275–301)
Potassium: 4 mmol/L (ref 3.5–5.1)
Total Protein: 7.4 g/dL (ref 6.4–8.2)

## 2013-06-26 LAB — CBC WITH DIFFERENTIAL/PLATELET
Basophil #: 0 10*3/uL (ref 0.0–0.1)
Basophil %: 0.5 %
Eosinophil %: 0.6 %
HCT: 39.8 % (ref 35.0–47.0)
Lymphocyte %: 19.7 %
MCHC: 34.7 g/dL (ref 32.0–36.0)
MCV: 100 fL (ref 80–100)
Monocyte %: 6.9 %
Neutrophil %: 72.3 %
RBC: 3.98 10*6/uL (ref 3.80–5.20)
RDW: 13.4 % (ref 11.5–14.5)

## 2013-07-06 ENCOUNTER — Other Ambulatory Visit: Payer: Self-pay

## 2013-07-08 LAB — CBC WITH DIFFERENTIAL/PLATELET
Basophil #: 0 10*3/uL (ref 0.0–0.1)
Basophil %: 0.8 %
HGB: 12.7 g/dL (ref 12.0–16.0)
Lymphocyte %: 19.6 %
MCH: 35 pg — ABNORMAL HIGH (ref 26.0–34.0)
MCHC: 34.6 g/dL (ref 32.0–36.0)
Monocyte #: 0.3 x10 3/mm (ref 0.2–0.9)
Monocyte %: 9.3 %
Neutrophil #: 1.9 10*3/uL (ref 1.4–6.5)
Platelet: 209 10*3/uL (ref 150–440)
RDW: 13.7 % (ref 11.5–14.5)
WBC: 2.8 10*3/uL — ABNORMAL LOW (ref 3.6–11.0)

## 2013-07-08 LAB — COMPREHENSIVE METABOLIC PANEL
Anion Gap: 6 — ABNORMAL LOW (ref 7–16)
BUN: 9 mg/dL (ref 7–18)
Bilirubin,Total: 0.2 mg/dL (ref 0.2–1.0)
Calcium, Total: 9 mg/dL (ref 8.5–10.1)
Co2: 30 mmol/L (ref 21–32)
EGFR (African American): >60
Glucose: 83 mg/dL (ref 65–99)
Osmolality: 275 (ref 275–301)
Potassium: 3.9 mmol/L (ref 3.5–5.1)
SGOT(AST): 20 U/L (ref 15–37)
Total Protein: 6.6 g/dL (ref 6.4–8.2)

## 2013-07-29 LAB — CBC WITH DIFFERENTIAL/PLATELET
Basophil #: 0 10*3/uL (ref 0.0–0.1)
Basophil %: 0.3 %
Eosinophil %: 0.2 %
HCT: 38.6 % (ref 35.0–47.0)
HGB: 13.5 g/dL (ref 12.0–16.0)
Lymphocyte #: 0.5 10*3/uL — ABNORMAL LOW (ref 1.0–3.6)
Lymphocyte %: 9.7 %
MCH: 35.4 pg — ABNORMAL HIGH (ref 26.0–34.0)
MCHC: 34.9 g/dL (ref 32.0–36.0)
MCV: 101 fL — ABNORMAL HIGH (ref 80–100)
Monocyte #: 0.3 x10 3/mm (ref 0.2–0.9)
Monocyte %: 5.1 %
RDW: 13.4 % (ref 11.5–14.5)
WBC: 5.3 10*3/uL (ref 3.6–11.0)

## 2013-07-29 LAB — COMPREHENSIVE METABOLIC PANEL
Albumin: 3.8 g/dL (ref 3.4–5.0)
Anion Gap: 4 — ABNORMAL LOW (ref 7–16)
Calcium, Total: 8.9 mg/dL (ref 8.5–10.1)
Co2: 30 mmol/L (ref 21–32)
Creatinine: 0.66 mg/dL (ref 0.60–1.30)
EGFR (African American): >60
EGFR (Non-African Amer.): >60
Glucose: 106 mg/dL — ABNORMAL HIGH (ref 65–99)
Osmolality: 275 (ref 275–301)
Potassium: 3.9 mmol/L (ref 3.5–5.1)
SGOT(AST): 22 U/L (ref 15–37)
Sodium: 138 mmol/L (ref 136–145)
Total Protein: 7.1 g/dL (ref 6.4–8.2)

## 2013-08-05 ENCOUNTER — Other Ambulatory Visit: Payer: Self-pay

## 2013-08-05 LAB — CBC WITH DIFFERENTIAL/PLATELET
Basophil #: 0 10*3/uL (ref 0.0–0.1)
Basophil %: 0.6 %
Eosinophil #: 0 10*3/uL (ref 0.0–0.7)
HCT: 39.1 % (ref 35.0–47.0)
HGB: 13.4 g/dL (ref 12.0–16.0)
Lymphocyte #: 0.5 10*3/uL — ABNORMAL LOW (ref 1.0–3.6)
Lymphocyte %: 14.8 %
Neutrophil %: 77.1 %
Platelet: 220 10*3/uL (ref 150–440)
RBC: 3.87 10*6/uL (ref 3.80–5.20)
WBC: 3.7 10*3/uL (ref 3.6–11.0)

## 2013-08-12 LAB — CBC WITH DIFFERENTIAL/PLATELET
Basophil #: 0 10*3/uL (ref 0.0–0.1)
Basophil %: 0.6 %
Eosinophil #: 0 10*3/uL (ref 0.0–0.7)
Eosinophil %: 0.1 %
HGB: 13.1 g/dL (ref 12.0–16.0)
MCHC: 35.2 g/dL (ref 32.0–36.0)
Monocyte %: 7 %
Neutrophil #: 2.5 10*3/uL (ref 1.4–6.5)
Neutrophil %: 74.9 %
RDW: 13.2 % (ref 11.5–14.5)
WBC: 3.4 10*3/uL — ABNORMAL LOW (ref 3.6–11.0)

## 2013-08-12 LAB — COMPREHENSIVE METABOLIC PANEL
Albumin: 4.2 g/dL (ref 3.4–5.0)
Alkaline Phosphatase: 88 U/L (ref 50–136)
Anion Gap: 7 (ref 7–16)
BUN: 8 mg/dL (ref 7–18)
Bilirubin,Total: 0.4 mg/dL (ref 0.2–1.0)
Chloride: 104 mmol/L (ref 98–107)
Co2: 27 mmol/L (ref 21–32)
Creatinine: 0.68 mg/dL (ref 0.60–1.30)
EGFR (African American): >60
Glucose: 102 mg/dL — ABNORMAL HIGH (ref 65–99)
Osmolality: 274 (ref 275–301)
Sodium: 138 mmol/L (ref 136–145)

## 2013-08-19 LAB — CBC WITH DIFFERENTIAL/PLATELET
Basophil #: 0 10*3/uL (ref 0.0–0.1)
Eosinophil #: 0 10*3/uL (ref 0.0–0.7)
HCT: 37.9 % (ref 35.0–47.0)
HGB: 13.2 g/dL (ref 12.0–16.0)
Lymphocyte #: 0.7 10*3/uL — ABNORMAL LOW (ref 1.0–3.6)
Lymphocyte %: 17.5 %
MCH: 35.2 pg — ABNORMAL HIGH (ref 26.0–34.0)
MCHC: 34.8 g/dL (ref 32.0–36.0)
Monocyte %: 6.9 %
Neutrophil #: 2.9 10*3/uL (ref 1.4–6.5)
Neutrophil %: 74.4 %
RBC: 3.76 10*6/uL — ABNORMAL LOW (ref 3.80–5.20)
WBC: 3.8 10*3/uL (ref 3.6–11.0)

## 2013-08-19 LAB — COMPREHENSIVE METABOLIC PANEL
Albumin: 4 g/dL (ref 3.4–5.0)
Alkaline Phosphatase: 90 U/L (ref 50–136)
Anion Gap: 7 (ref 7–16)
BUN: 12 mg/dL (ref 7–18)
Bilirubin,Total: 0.3 mg/dL (ref 0.2–1.0)
Chloride: 105 mmol/L (ref 98–107)
Co2: 26 mmol/L (ref 21–32)
EGFR (African American): >60
EGFR (Non-African Amer.): >60
Glucose: 95 mg/dL (ref 65–99)
Potassium: 4 mmol/L (ref 3.5–5.1)
SGPT (ALT): 16 U/L (ref 12–78)
Sodium: 138 mmol/L (ref 136–145)

## 2013-09-05 ENCOUNTER — Other Ambulatory Visit: Payer: Self-pay

## 2014-02-17 ENCOUNTER — Encounter: Payer: Self-pay | Admitting: Internal Medicine

## 2014-02-17 ENCOUNTER — Ambulatory Visit (INDEPENDENT_AMBULATORY_CARE_PROVIDER_SITE_OTHER): Payer: Medicare Other | Admitting: Internal Medicine

## 2014-02-17 ENCOUNTER — Other Ambulatory Visit (HOSPITAL_COMMUNITY)
Admission: RE | Admit: 2014-02-17 | Discharge: 2014-02-17 | Disposition: A | Discharge status: Home / Self Care | Payer: Medicare Other | Source: Ambulatory Visit | Attending: Internal Medicine | Admitting: Internal Medicine

## 2014-02-17 VITALS — BP 100/68 | HR 70 | Temp 98.1°F | Resp 16 | Wt 100.5 lb

## 2014-02-17 DIAGNOSIS — Z1239 Encounter for other screening for malignant neoplasm of breast: Secondary | ICD-10-CM

## 2014-02-17 DIAGNOSIS — R87810 Cervical high risk human papillomavirus (HPV) DNA test positive: Secondary | ICD-10-CM | POA: Insufficient documentation

## 2014-02-17 DIAGNOSIS — E559 Vitamin D deficiency, unspecified: Secondary | ICD-10-CM

## 2014-02-17 DIAGNOSIS — Z13220 Encounter for screening for lipoid disorders: Secondary | ICD-10-CM

## 2014-02-17 DIAGNOSIS — C719 Malignant neoplasm of brain, unspecified: Secondary | ICD-10-CM

## 2014-02-17 DIAGNOSIS — Z1211 Encounter for screening for malignant neoplasm of colon: Secondary | ICD-10-CM

## 2014-02-17 DIAGNOSIS — R636 Underweight: Secondary | ICD-10-CM

## 2014-02-17 DIAGNOSIS — Z1151 Encounter for screening for human papillomavirus (HPV): Secondary | ICD-10-CM | POA: Insufficient documentation

## 2014-02-17 DIAGNOSIS — Z124 Encounter for screening for malignant neoplasm of cervix: Secondary | ICD-10-CM | POA: Insufficient documentation

## 2014-02-17 DIAGNOSIS — Z0000 Encounter for general adult medical examination without abnormal findings: Secondary | ICD-10-CM

## 2014-02-17 DIAGNOSIS — D7589 Other specified diseases of blood and blood-forming organs: Secondary | ICD-10-CM

## 2014-02-17 LAB — CBC WITH DIFFERENTIAL/PLATELET
BASOS ABS: 0 10*3/uL (ref 0.0–0.1)
Basophils Relative: 0.5 % (ref 0.0–3.0)
Eosinophils Absolute: 0 10*3/uL (ref 0.0–0.7)
Eosinophils Relative: 0.9 % (ref 0.0–5.0)
HEMATOCRIT: 38.5 % (ref 36.0–46.0)
HEMOGLOBIN: 12.9 g/dL (ref 12.0–15.0)
LYMPHS ABS: 0.5 10*3/uL — AB (ref 0.7–4.0)
Lymphocytes Relative: 13.6 % (ref 12.0–46.0)
MCHC: 33.5 g/dL (ref 30.0–36.0)
MCV: 100.2 fl — ABNORMAL HIGH (ref 78.0–100.0)
Monocytes Absolute: 0.1 10*3/uL (ref 0.1–1.0)
Monocytes Relative: 3.7 % (ref 3.0–12.0)
NEUTROS ABS: 3.1 10*3/uL (ref 1.4–7.7)
Neutrophils Relative %: 81.3 % — ABNORMAL HIGH (ref 43.0–77.0)
Platelets: 234 10*3/uL (ref 150.0–400.0)
RBC: 3.84 Mil/uL — ABNORMAL LOW (ref 3.87–5.11)
RDW: 13.2 % (ref 11.5–14.6)
WBC: 3.8 10*3/uL — ABNORMAL LOW (ref 4.5–10.5)

## 2014-02-17 LAB — MAGNESIUM: MAGNESIUM: 2 mg/dL (ref 1.5–2.5)

## 2014-02-17 LAB — TSH: TSH: 2 u[IU]/mL (ref 0.35–5.50)

## 2014-02-17 LAB — COMPREHENSIVE METABOLIC PANEL
ALBUMIN: 4 g/dL (ref 3.5–5.2)
ALT: 15 U/L (ref 0–35)
AST: 18 U/L (ref 0–37)
Alkaline Phosphatase: 75 U/L (ref 39–117)
BUN: 9 mg/dL (ref 6–23)
CALCIUM: 9.2 mg/dL (ref 8.4–10.5)
CHLORIDE: 101 meq/L (ref 96–112)
CO2: 29 meq/L (ref 19–32)
Creatinine, Ser: 0.6 mg/dL (ref 0.4–1.2)
GFR: 118.55 mL/min (ref >60.00)
GLUCOSE: 88 mg/dL (ref 70–99)
POTASSIUM: 4.4 meq/L (ref 3.5–5.1)
Sodium: 138 mEq/L (ref 135–145)
Total Bilirubin: 0.6 mg/dL (ref 0.3–1.2)
Total Protein: 6.7 g/dL (ref 6.0–8.3)

## 2014-02-17 LAB — LIPID PANEL
CHOL/HDL RATIO: 3
Cholesterol: 250 mg/dL — ABNORMAL HIGH (ref 0–200)
HDL: 81 mg/dL (ref >39.00)
LDL Cholesterol: 153 mg/dL — ABNORMAL HIGH (ref 0–99)
Triglycerides: 78 mg/dL (ref 0.0–149.0)
VLDL: 15.6 mg/dL (ref 0.0–40.0)

## 2014-02-17 LAB — VITAMIN B12: Vitamin B-12: 211 pg/mL (ref 211–911)

## 2014-02-17 MED ORDER — TETANUS-DIPHTH-ACELL PERTUSSIS 5-2.5-18.5 LF-MCG/0.5 IM SUSP: 0.5000 mL | Freq: Once | INTRAMUSCULAR | Status: AC

## 2014-02-17 NOTE — Patient Instructions (Signed)
You had your annual wellness exam today  We will schedule your mammogram soon.  Please use the stool kit to send [place] back a sample to test for blood.  This is your colon CA screening test.   You need to have a TDaP vaccine .  You can have this done here or at your local pharmacy with a prescription.  We will contact you with the bloodwork results

## 2014-02-17 NOTE — Progress Notes (Signed)
Patient ID: Gina Cross, female   DOB: [birth year], 53 y.o.   MRN: [id number]   Subjective:     MERSADEZ Gina Cross is a 53 y.o. female and is here for a comprehensive physical exam. The patient reports problems - as follows:[name]  History   Social History  . Marital Status: Married    Spouse Name: N/A    Number of Children: N/A  . Years of Education: N/A   Occupational History  . Not on file.   Social History Main Topics  . Smoking status: Never Smoker   . Smokeless tobacco: Never Used  . Alcohol Use: No  . Drug Use: No  . Sexual Activity: Not on file   Other Topics Concern  . Not on file   Social History Narrative  . No narrative on file   Health Maintenance  Topic Date Due  . Tetanus/tdap  06/27/1981  . Colonoscopy  06/27/2012  . Influenza Vaccine  06/05/2014  . Mammogram  08/25/2014  . Pap Smear  02/17/2017    The following portions of the patient's history were reviewed and updated as appropriate: allergies, current medications, past family history, past medical history, past social history, past surgical history and problem list.  Review of Systems A comprehensive review of systems was negative.   Objective:   BP 100/68  Pulse 70  Temp(Src) 98.1 F (36.7 C) (Oral)  Resp 16  Wt 100 lb 8 oz (45.587 kg)  SpO2 99%  General Appearance:    Thin, alert, cooperative, no distress, appears stated age  Head:    Normocephalic, without obvious abnormality, atraumatic  Eyes:    PERRL, conjunctiva/corneas clear, EOM's intact, fundi    benign, both eyes  Ears:    Normal TM's and external ear canals, both ears  Nose:   Nares normal, septum midline, mucosa normal, no drainage    or sinus tenderness  Throat:   Lips, mucosa, and tongue normal; teeth and gums normal  Neck:   Supple, symmetrical, trachea midline, no adenopathy;    thyroid:  no enlargement/tenderness/nodules; no carotid   bruit or JVD  Back:     Symmetric, no curvature, ROM normal, no CVA tenderness  Lungs:      Clear to auscultation bilaterally, respirations unlabored  Chest Wall:    No tenderness or deformity   Heart:    Regular rate and rhythm, S1 and S2 normal, no murmur, rub   or gallop  Breast Exam:    No tenderness, masses, or nipple abnormality  Abdomen:     Soft, non-tender, bowel sounds active all four quadrants,    no masses, no organomegaly  Genitalia:    Pelvic: cervix normal in appearance, external genitalia normal, no adnexal masses or tenderness, no cervical motion tenderness, rectovaginal septum normal, uterus normal size, shape, and consistency and vagina normal without discharge  Extremities:   Extremities normal, atraumatic, no cyanosis or edema  Pulses:   2+ and symmetric all extremities  Skin:   Skin color,nlormal,  Texture very dry, turgor normal, no rashes or lesions  Lymph nodes:   Cervical, supraclavicular, and axillary nodes normal  Neurologic:   CNII-XII intact, normal strength, sensation and reflexes    throughout     Assessment and Plan:   Visit for preventive health examination Annual comprehensive exam was done including breast, pelvic and PAP smear. All screenings have been addressed .   Underweight Secondary to anorexia from uncontrolled anxiety.  Now improving.   Macrocytosis Secondary to b12  deficiency, with folic acid on the low side as well. Return for weekly b12 injections x 3.   Fibrillary astrocytoma Under treatment with chemotherapy, now suspended.  Deferring XRT  For a few months  Despite knowledge of the consequences of    Updated Medication List Outpatient Encounter Prescriptions as of 02/17/2014  Medication Sig  . carbamazepine (TEGRETOL XR) 200 MG 12 hr tablet Take 3 tablets by mouth daily.  . citalopram (CELEXA) 20 MG tablet Take 20 mg by mouth daily.  . clonazePAM (KLONOPIN) 0.5 MG tablet Take 1 tablet by mouth 2 (two) times daily.  [name] gabapentin (NEURONTIN) 400 MG capsule Take 3 capsules by mouth every am and 2 capsules three times a day  for a ttal of 9 capsules  . ibuprofen (ADVIL,MOTRIN) 200 MG tablet Take 800 mg by mouth every 6 (six) hours as needed for pain.  [name] penciclovir (DENAVIR) 1 % cream Apply 1 application topically as needed.  . Cyanocobalamin 1000 MCG SUBL Place 1 tablet (1,000 mcg total) under the tongue daily.  . ergocalciferol (DRISDOL) 50000 UNITS capsule Take 1 capsule (50,000 Units total) by mouth once a week.  . folic acid (FOLVITE) 1 MG tablet Take 1 tablet (1 mg total) by mouth daily.  [name] lomustine (CEENU) 10 MG capsule Take 3 capsules with 100mg  (total dose of 1430mg ) by mouth at night on day 1 of 6 day cycle.  . lomustine (CEENU) 100 MG capsule Take 1 capsule with 30mg  (total dose 130mg ) by mouth at night on day 1 of 6 day cycle.  [name] LORazepam (ATIVAN) 0.5 MG tablet Take 1 tablet by mouth every 8 (eight) hours as needed.  . ondansetron (ZOFRAN) 8 MG tablet Take 1 tablet by mouth 1 hour before lomustine on day 1 and every 8 hours as needed for nausea.  . Tdap (BOOSTRIX) 5-2.5-18.5 LF-MCG/0.5 injection Inject 0.5 mLs into the muscle once.

## 2014-02-18 DIAGNOSIS — D7589 Other specified diseases of blood and blood-forming organs: Secondary | ICD-10-CM | POA: Insufficient documentation

## 2014-02-18 DIAGNOSIS — E559 Vitamin D deficiency, unspecified: Secondary | ICD-10-CM | POA: Insufficient documentation

## 2014-02-18 LAB — VITAMIN D 25 HYDROXY (VIT D DEFICIENCY, FRACTURES): Vit D, 25-Hydroxy: 20 ng/mL — ABNORMAL LOW (ref 30–89)

## 2014-02-18 LAB — FOLATE RBC: RBC FOLATE: 362 ng/mL (ref >280)

## 2014-02-18 MED ORDER — CYANOCOBALAMIN 1000 MCG SL SUBL: 1.0000 | SUBLINGUAL_TABLET | Freq: Every day | SUBLINGUAL | Status: AC

## 2014-02-18 MED ORDER — FOLIC ACID 1 MG PO TABS: 1.0000 mg | ORAL_TABLET | Freq: Every day | ORAL | Status: AC

## 2014-02-18 MED ORDER — ERGOCALCIFEROL 1.25 MG (50000 UT) PO CAPS: 50000.0000 [IU] | ORAL_CAPSULE | ORAL | Status: DC

## 2014-02-18 NOTE — Assessment & Plan Note (Signed)
Under treatment with chemotherapy, now suspended.  Deferring XRT  For a few months  Despite knowledge of the consequences of

## 2014-02-18 NOTE — Assessment & Plan Note (Signed)
Annual comprehensive exam was done including breast, pelvic and PAP smear. All screenings have been addressed .  

## 2014-02-18 NOTE — Assessment & Plan Note (Addendum)
Secondary to b12 deficiency, with folic acid on the low side as well. Return for weekly b12 injections x 3.

## 2014-02-18 NOTE — Assessment & Plan Note (Signed)
Secondary to anorexia from uncontrolled anxiety.  Now improving.

## 2014-02-19 ENCOUNTER — Encounter: Payer: Self-pay | Admitting: *Deleted

## 2014-02-23 ENCOUNTER — Ambulatory Visit: Payer: Medicare Other

## 2014-03-01 ENCOUNTER — Ambulatory Visit: Payer: Medicare Other

## 2014-03-05 ENCOUNTER — Ambulatory Visit (INDEPENDENT_AMBULATORY_CARE_PROVIDER_SITE_OTHER): Payer: Medicare Other | Admitting: *Deleted

## 2014-03-05 DIAGNOSIS — E538 Deficiency of other specified B group vitamins: Secondary | ICD-10-CM

## 2014-03-05 MED ORDER — CYANOCOBALAMIN 1000 MCG/ML IJ SOLN
1000.0000 ug | Freq: Once | INTRAMUSCULAR | Status: AC
  Administered 2014-03-05: 1000 ug via INTRAMUSCULAR

## 2014-03-09 ENCOUNTER — Ambulatory Visit: Payer: Self-pay | Admitting: Internal Medicine

## 2014-03-12 ENCOUNTER — Ambulatory Visit (INDEPENDENT_AMBULATORY_CARE_PROVIDER_SITE_OTHER): Payer: Medicare Other | Admitting: *Deleted

## 2014-03-12 DIAGNOSIS — E538 Deficiency of other specified B group vitamins: Secondary | ICD-10-CM

## 2014-03-12 MED ORDER — CYANOCOBALAMIN 1000 MCG/ML IJ SOLN
1000.0000 ug | Freq: Once | INTRAMUSCULAR | Status: AC
  Administered 2014-03-12: 1000 ug via INTRAMUSCULAR

## 2014-03-19 ENCOUNTER — Ambulatory Visit (INDEPENDENT_AMBULATORY_CARE_PROVIDER_SITE_OTHER): Payer: Medicare Other | Admitting: *Deleted

## 2014-03-19 DIAGNOSIS — E538 Deficiency of other specified B group vitamins: Secondary | ICD-10-CM

## 2014-03-19 MED ORDER — CYANOCOBALAMIN 1000 MCG/ML IJ SOLN
1000.0000 ug | Freq: Once | INTRAMUSCULAR | Status: AC
  Administered 2014-03-19: 1000 ug via INTRAMUSCULAR

## 2014-04-05 ENCOUNTER — Encounter: Payer: Self-pay | Admitting: Internal Medicine

## 2014-04-26 ENCOUNTER — Encounter: Payer: Self-pay | Admitting: Internal Medicine

## 2014-04-26 ENCOUNTER — Ambulatory Visit (INDEPENDENT_AMBULATORY_CARE_PROVIDER_SITE_OTHER): Payer: Medicare Other | Admitting: Internal Medicine

## 2014-04-26 VITALS — BP 100/68 | HR 80 | Temp 97.8°F | Resp 16 | Ht 62.5 in | Wt 103.0 lb

## 2014-04-26 DIAGNOSIS — H6123 Impacted cerumen, bilateral: Secondary | ICD-10-CM

## 2014-04-26 DIAGNOSIS — J069 Acute upper respiratory infection, unspecified: Secondary | ICD-10-CM

## 2014-04-26 DIAGNOSIS — H6120 Impacted cerumen, unspecified ear: Secondary | ICD-10-CM

## 2014-04-26 MED ORDER — DOCUSATE SODIUM 50 MG/5ML PO LIQD: 50.0000 mg | Freq: Every day | ORAL | Status: AC

## 2014-04-26 NOTE — Progress Notes (Signed)
Pre-visit discussion using our clinic review tool. No additional management support is needed unless otherwise documented below in the visit note.  

## 2014-04-26 NOTE — Patient Instructions (Signed)
You can try Sudafed PE 10 mg every 8 hours as needed for ear pressure  Colace liquid to dissolve the cerumen  You can inflate your ear oftenby holding your nose if it helps   Return  For RN visit for irrigation Friday afternoon

## 2014-04-27 ENCOUNTER — Encounter: Payer: Self-pay | Admitting: Internal Medicine

## 2014-04-27 DIAGNOSIS — H6120 Impacted cerumen, unspecified ear: Secondary | ICD-10-CM | POA: Insufficient documentation

## 2014-04-27 DIAGNOSIS — J069 Acute upper respiratory infection, unspecified: Secondary | ICD-10-CM | POA: Insufficient documentation
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
## 2014-04-27 NOTE — Assessment & Plan Note (Signed)
Exam is notable only for cerumen impaction .  Symptoms of  URI are caused by viral infection currently given her current symptoms.   I have explained that in viral URIS, an antibiotic will not help the symptoms and will increase the risk of developing diarrhea. Advised to use oral and nasal decongestants,  Ibuprofen 400 mg and tylenol 650 mq 8 hrs for aches and pains,

## 2014-04-27 NOTE — Assessment & Plan Note (Signed)
Advised to use liquid colace and return for irrigation

## 2014-04-28 ENCOUNTER — Ambulatory Visit (INDEPENDENT_AMBULATORY_CARE_PROVIDER_SITE_OTHER): Payer: Medicare Other | Admitting: *Deleted

## 2014-04-28 DIAGNOSIS — H6120 Impacted cerumen, unspecified ear: Secondary | ICD-10-CM

## 2014-04-28 DIAGNOSIS — H6123 Impacted cerumen, bilateral: Secondary | ICD-10-CM

## 2014-04-28 NOTE — Progress Notes (Signed)
Ear wax impaction bilateral flushed with warm water and peroxide bilateral ears until impaction cleared bilaterally patient doing well no dizziness voiced relief.

## 2014-12-18 ENCOUNTER — Emergency Department: Payer: Self-pay | Admitting: Emergency Medicine

## 2015-01-06 ENCOUNTER — Other Ambulatory Visit: Admit: 2015-01-06 | Disposition: A | Discharge status: Home / Self Care | Payer: Self-pay

## 2015-02-04 ENCOUNTER — Other Ambulatory Visit: Admit: 2015-02-04 | Disposition: A | Discharge status: Home / Self Care | Payer: Self-pay

## 2015-05-25 ENCOUNTER — Encounter: Payer: BC Managed Care – PPO | Admitting: Internal Medicine

## 2015-06-01 ENCOUNTER — Ambulatory Visit (INDEPENDENT_AMBULATORY_CARE_PROVIDER_SITE_OTHER): Payer: Medicare Other | Admitting: Internal Medicine

## 2015-06-01 ENCOUNTER — Encounter: Payer: Self-pay | Admitting: Internal Medicine

## 2015-06-01 VITALS — BP 100/60 | HR 73 | Temp 98.1°F | Resp 16 | Ht 63.25 in | Wt 111.0 lb

## 2015-06-01 DIAGNOSIS — R5383 Other fatigue: Secondary | ICD-10-CM | POA: Diagnosis not present

## 2015-06-01 DIAGNOSIS — G40309 Generalized idiopathic epilepsy and epileptic syndromes, not intractable, without status epilepticus: Secondary | ICD-10-CM

## 2015-06-01 DIAGNOSIS — E038 Other specified hypothyroidism: Secondary | ICD-10-CM

## 2015-06-01 DIAGNOSIS — Z1239 Encounter for other screening for malignant neoplasm of breast: Secondary | ICD-10-CM | POA: Diagnosis not present

## 2015-06-01 DIAGNOSIS — Z0000 Encounter for general adult medical examination without abnormal findings: Secondary | ICD-10-CM

## 2015-06-01 DIAGNOSIS — E785 Hyperlipidemia, unspecified: Secondary | ICD-10-CM

## 2015-06-01 DIAGNOSIS — C719 Malignant neoplasm of brain, unspecified: Secondary | ICD-10-CM | POA: Diagnosis not present

## 2015-06-01 DIAGNOSIS — E559 Vitamin D deficiency, unspecified: Secondary | ICD-10-CM | POA: Diagnosis not present

## 2015-06-01 DIAGNOSIS — R4189 Other symptoms and signs involving cognitive functions and awareness: Secondary | ICD-10-CM

## 2015-06-01 DIAGNOSIS — E034 Atrophy of thyroid (acquired): Secondary | ICD-10-CM

## 2015-06-01 NOTE — Patient Instructions (Signed)
Ask your oncologist about the following   TDaP vaccine Prevnar vaccine  Colonoscopy (when is it safe?)  Ask your neurologis tif you can take 1/2 clonazepam to help you rest  Health Maintenance Adopting a healthy lifestyle and getting preventive care can go a long way to promote health and wellness. Talk with your health care provider about what schedule of regular examinations is right for you. This is a good chance for you to check in with your provider about disease prevention and staying healthy. In between checkups, there are plenty of things you can do on your own. Experts have done a lot of research about which lifestyle changes and preventive measures are most likely to keep you healthy. Ask your health care provider for more information. WEIGHT AND DIET  Eat a healthy diet  Be sure to include plenty of vegetables, fruits, low-fat dairy products, and lean protein.  Do not eat a lot of foods high in solid fats, added sugars, or salt.  Get regular exercise. This is one of the most important things you can do for your health.  Most adults should exercise for at least 150 minutes each week. The exercise should increase your heart rate and make you sweat (moderate-intensity exercise).  Most adults should also do strengthening exercises at least twice a week. This is in addition to the moderate-intensity exercise.  Maintain a healthy weight  Body mass index (BMI) is a measurement that can be used to identify possible weight problems. It estimates body fat based on height and weight. Your health care provider can help determine your BMI and help you achieve or maintain a healthy weight.  For females 21 years of age and older:   A BMI below 18.5 is considered underweight.  A BMI of 18.5 to 24.9 is normal.  A BMI of 25 to 29.9 is considered overweight.  A BMI of 30 and above is considered obese.  Watch levels of cholesterol and blood lipids  You should start having your blood  tested for lipids and cholesterol at 53 years of age, then have this test every 5 years.  You may need to have your cholesterol levels checked more often if:  Your lipid or cholesterol levels are high.  You are older than 53 years of age.  You are at high risk for heart disease.  CANCER SCREENING   Lung Cancer  Lung cancer screening is recommended for adults 84-38 years old who are at high risk for lung cancer because of a history of smoking.  A yearly low-dose CT scan of the lungs is recommended for people who:  Currently smoke.  Have quit within the past 15 years.  Have at least a 30-pack-year history of smoking. A pack year is smoking an average of one pack of cigarettes a day for 1 year.  Yearly screening should continue until it has been 15 years since you quit.  Yearly screening should stop if you develop a health problem that would prevent you from having lung cancer treatment.  Breast Cancer  Practice breast self-awareness. This means understanding how your breasts normally appear and feel.  It also means doing regular breast self-exams. Let your health care provider know about any changes, no matter how small.  If you are in your 20s or 30s, you should have a clinical breast exam (CBE) by a health care provider every 1-3 years as part of a regular health exam.  If you are 47 or older, have a CBE every  year. Also consider having a breast X-ray (mammogram) every year.  If you have a family history of breast cancer, talk to your health care provider about genetic screening.  If you are at high risk for breast cancer, talk to your health care provider about having an MRI and a mammogram every year.  Breast cancer gene (BRCA) assessment is recommended for women who have family members with BRCA-related cancers. BRCA-related cancers include:  Breast.  Ovarian.  Tubal.  Peritoneal cancers.  Results of the assessment will determine the need for genetic counseling  and BRCA1 and BRCA2 testing. Cervical Cancer Routine pelvic examinations to screen for cervical cancer are no longer recommended for nonpregnant women who are considered low risk for cancer of the pelvic organs (ovaries, uterus, and vagina) and who do not have symptoms. A pelvic examination may be necessary if you have symptoms including those associated with pelvic infections. Ask your health care provider if a screening pelvic exam is right for you.   The Pap test is the screening test for cervical cancer for women who are considered at risk.  If you had a hysterectomy for a problem that was not cancer or a condition that could lead to cancer, then you no longer need Pap tests.  If you are older than 65 years, and you have had normal Pap tests for the past 10 years, you no longer need to have Pap tests.  If you have had past treatment for cervical cancer or a condition that could lead to cancer, you need Pap tests and screening for cancer for at least 20 years after your treatment.  If you no longer get a Pap test, assess your risk factors if they change (such as having a new sexual partner). This can affect whether you should start being screened again.  Some women have medical problems that increase their chance of getting cervical cancer. If this is the case for you, your health care provider may recommend more frequent screening and Pap tests.  The human papillomavirus (HPV) test is another test that may be used for cervical cancer screening. The HPV test looks for the virus that can cause cell changes in the cervix. The cells collected during the Pap test can be tested for HPV.  The HPV test can be used to screen women 3 years of age and older. Getting tested for HPV can extend the interval between normal Pap tests from three to five years.  An HPV test also should be used to screen women of any age who have unclear Pap test results.  After 53 years of age, women should have HPV testing  as often as Pap tests.  Colorectal Cancer  This type of cancer can be detected and often prevented.  Routine colorectal cancer screening usually begins at 53 years of age and continues through 53 years of age.  Your health care provider may recommend screening at an earlier age if you have risk factors for colon cancer.  Your health care provider may also recommend using home test kits to check for hidden blood in the stool.  A small camera at the end of a tube can be used to examine your colon directly (sigmoidoscopy or colonoscopy). This is done to check for the earliest forms of colorectal cancer.  Routine screening usually begins at age 23.  Direct examination of the colon should be repeated every 5-10 years through 53 years of age. However, you may need to be screened more often if early  forms of precancerous polyps or small growths are found. Skin Cancer  Check your skin from head to toe regularly.  Tell your health care provider about any new moles or changes in moles, especially if there is a change in a mole's shape or color.  Also tell your health care provider if you have a mole that is larger than the size of a pencil eraser.  Always use sunscreen. Apply sunscreen liberally and repeatedly throughout the day.  Protect yourself by wearing long sleeves, pants, a wide-brimmed hat, and sunglasses whenever you are outside. HEART DISEASE, DIABETES, AND HIGH BLOOD PRESSURE   Have your blood pressure checked at least every 1-2 years. High blood pressure causes heart disease and increases the risk of stroke.  If you are between 54 years and 43 years old, ask your health care provider if you should take aspirin to prevent strokes.  Have regular diabetes screenings. This involves taking a blood sample to check your fasting blood sugar level.  If you are at a normal weight and have a low risk for diabetes, have this test once every three years after 53 years of age.  If you are  overweight and have a high risk for diabetes, consider being tested at a younger age or more often. PREVENTING INFECTION  Hepatitis B  If you have a higher risk for hepatitis B, you should be screened for this virus. You are considered at high risk for hepatitis B if:  You were born in a country where hepatitis B is common. Ask your health care provider which countries are considered high risk.  Your parents were born in a high-risk country, and you have not been immunized against hepatitis B (hepatitis B vaccine).  You have HIV or AIDS.  You use needles to inject street drugs.  You live with someone who has hepatitis B.  You have had sex with someone who has hepatitis B.  You get hemodialysis treatment.  You take certain medicines for conditions, including cancer, organ transplantation, and autoimmune conditions. Hepatitis C  Blood testing is recommended for:  Everyone born from [birth year] through 1964.  Anyone with known risk factors for hepatitis C. Sexually transmitted infections (STIs)  You should be screened for sexually transmitted infections (STIs) including gonorrhea and chlamydia if:  You are sexually active and are younger than 53 years of age.  You are older than 53 years of age and your health care provider tells you that you are at risk for this type of infection.  Your sexual activity has changed since you were last screened and you are at an increased risk for chlamydia or gonorrhea. Ask your health care provider if you are at risk.  If you do not have HIV, but are at risk, it may be recommended that you take a prescription medicine daily to prevent HIV infection. This is called pre-exposure prophylaxis (PrEP). You are considered at risk if:  You are sexually active and do not regularly use condoms or know the HIV status of your partner(s).  You take drugs by injection.  You are sexually active with a partner who has HIV. Talk with your health care provider  about whether you are at high risk of being infected with HIV. If you choose to begin PrEP, you should first be tested for HIV. You should then be tested every 3 months for as long as you are taking PrEP.  PREGNANCY   If you are premenopausal and you may become pregnant, ask your  health care provider about preconception counseling.  If you may become pregnant, take 400 to 800 micrograms (mcg) of folic acid every day.  If you want to prevent pregnancy, talk to your health care provider about birth control (contraception). OSTEOPOROSIS AND MENOPAUSE   Osteoporosis is a disease in which the bones lose minerals and strength with aging. This can result in serious bone fractures. Your risk for osteoporosis can be identified using a bone density scan.  If you are 82 years of age or older, or if you are at risk for osteoporosis and fractures, ask your health care provider if you should be screened.  Ask your health care provider whether you should take a calcium or vitamin D supplement to lower your risk for osteoporosis.  Menopause may have certain physical symptoms and risks.  Hormone replacement therapy may reduce some of these symptoms and risks. Talk to your health care provider about whether hormone replacement therapy is right for you.  HOME CARE INSTRUCTIONS   Schedule regular health, dental, and eye exams.  Stay current with your immunizations.   Do not use any tobacco products including cigarettes, chewing tobacco, or electronic cigarettes.  If you are pregnant, do not drink alcohol.  If you are breastfeeding, limit how much and how often you drink alcohol.  Limit alcohol intake to no more than 1 drink per day for nonpregnant women. One drink equals 12 ounces of beer, 5 ounces of wine, or 1 ounces of hard liquor.  Do not use street drugs.  Do not share needles.  Ask your health care provider for help if you need support or information about quitting drugs.  Tell your  health care provider if you often feel depressed.  Tell your health care provider if you have ever been abused or do not feel safe at home. Document Released: 05/07/2011 Document Revised: 03/08/2014 Document Reviewed: 09/23/2013 [facility] Patient Information 2015 [place], [place]. This information is not intended to replace advice given to you by your health care provider. Make sure you discuss any questions you have with your health care provider.

## 2015-06-01 NOTE — Progress Notes (Addendum)
Patient ID: Gina Cross, female    DOB: 03/16/1961  Age: 54 y.o. MRN: [id number]  The patient is here for annualwellness examination and management of other chronic and acute problems.  PAP smear: ASCUS ,  HPV negative 2015: fu 3 years per ACOG guidelines Mammogram normal may 2015 due now  No prior colon Ca screening  The risk factors are reflected in the social history.  The roster of all physicians providing medical care to patient - is listed in the Snapshot section of the chart.  Activities of daily living:  The patient is 100% independent in all ADLs: dressing, toileting, feeding as well as independent mobility  Home safety : The patient has smoke detectors in the home. They wear seatbelts.  There are no firearms at home. There is no violence in the home.   There is no risks for hepatitis, STDs or HIV. There is no   history of blood transfusion. They have no travel history to infectious disease endemic areas of the world.  The patient has seen their dentist in the last six month. They have seen their eye doctor in the last year. They admit to slight hearing difficulty with regard to whispered voices and some television programs.  They have deferred audiologic testing in the last year.  They do not  have excessive sun exposure. Discussed the need for sun protection: hats, long sleeves and use of sunscreen if there is significant sun exposure.   Diet: the importance of a healthy diet is discussed. They do have a healthy diet.  The benefits of regular aerobic exercise were discussed. She walks 4 times per week ,  20 minutes.   Depression screen: there are no signs or vegative symptoms of depression- irritability, change in appetite, anhedonia, sadness/tearfullness.  Cognitive assessment: the patient manages all their financial and personal affairs and is actively engaged. They could relate day,date,year and events; recalled 2/3 objects at 3 minutes; performed clock-face test  normally.  The following portions of the patient's history were reviewed and updated as appropriate: allergies, current medications, past family history, past medical history,  past surgical history, past social history  and problem list.  Visual acuity was not assessed per patient preference since she has regular follow up with her ophthalmologist. Hearing and body mass index were assessed and reviewed.   During the course of the visit the patient was educated and counseled about appropriate screening and preventive services including : fall prevention , diabetes screening, nutrition counseling, colorectal cancer screening, and recommended immunizations.    CC: The primary encounter diagnosis was Breast cancer screening. Diagnoses of Other fatigue, Vitamin D deficiency, Hyperlipidemia, Fibrillary astrocytoma, Epilepsy, generalized, convulsive, Visit for preventive health examination, Cognitive deficits, and Hypothyroidism due to acquired atrophy of thyroid were also pertinent to this visit.   She is receiving  Carboplatin infusions every 3 wees and Neulasta at [facility] for brain tuomor,  Tumor is reportedly shrinking and her oncologist wants to conintue treatments for as long as possible.  Her seizures have been occurring  less often with clonazepam addition.   She is having more memory deficits, and is often getting lost in town unless she uses her cell phone's navigational unit   She has not ha any traffic accidents,  Falls  Or medication mishaps.     History Gina Cross has a past medical history of Seizure disorder and OCD (obsessive compulsive disorder).   She has past surgical history that includes Stereotactic brain biopsy (Left, 1998) and Cosmetic  surgery (Bilateral).   Her family history includes Deep vein thrombosis in her mother; Diabetes in her mother; Heart disease in her mother. There is no history of Cancer.She reports that she has never smoked. She has never used smokeless tobacco. She  reports that she does not drink alcohol or use illicit drugs.  Outpatient Prescriptions Prior to Visit  Medication Sig Dispense Refill  . carbamazepine (TEGRETOL XR) 200 MG 12 hr tablet Take 3 tablets by mouth daily.    . citalopram (CELEXA) 20 MG tablet Take 20 mg by mouth daily.    [name] gabapentin (NEURONTIN) 400 MG capsule Take 3 capsules by mouth every am and 2 capsules three times a day for a ttal of 9 capsules    . ibuprofen (ADVIL,MOTRIN) 200 MG tablet Take 800 mg by mouth every 6 (six) hours as needed for pain.    [name] ondansetron (ZOFRAN) 8 MG tablet Take 1 tablet by mouth 1 hour before lomustine on day 1 and every 8 hours as needed for nausea.    [name] penciclovir (DENAVIR) 1 % cream Apply 1 application topically as needed.    . clonazePAM (KLONOPIN) 0.5 MG tablet Take 1 tablet by mouth 2 (two) times daily.    . Cyanocobalamin 1000 MCG SUBL Place 1 tablet (1,000 mcg total) under the tongue daily. (Patient not taking: Reported on 06/01/2015) 90 tablet 3  . docusate (COLACE) 50 MG/5ML liquid Take 5 mLs (50 mg total) by mouth daily. Instill a few drops into each ear nightly for cerumen impaction (Patient not taking: Reported on 3/84/6659) 935 mL 0  . folic acid (FOLVITE) 1 MG tablet Take 1 tablet (1 mg total) by mouth daily. (Patient not taking: Reported on 06/01/2015) 90 tablet 1  . lomustine (CEENU) 10 MG capsule Take 3 capsules with 100mg  (total dose of 1430mg ) by mouth at night on day 1 of 6 day cycle.    . lomustine (CEENU) 100 MG capsule Take 1 capsule with 30mg  (total dose 130mg ) by mouth at night on day 1 of 6 day cycle.    . Tdap (BOOSTRIX) 5-2.5-18.5 LF-MCG/0.5 injection Inject 0.5 mLs into the muscle once. (Patient not taking: Reported on 06/01/2015) 0.5 mL 0  . ergocalciferol (DRISDOL) 50000 UNITS capsule Take 1 capsule (50,000 Units total) by mouth once a week. (Patient not taking: Reported on 06/01/2015) 12 capsule 0  . LORazepam (ATIVAN) 0.5 MG tablet Take 1 tablet by mouth every 8 (eight)  hours as needed.     No facility-administered medications prior to visit.    Review of Systems   Patient denies headache, fevers, malaise, unintentional weight loss, skin rash, eye pain, sinus congestion and sinus pain, sore throat, dysphagia,  hemoptysis , cough, dyspnea, wheezing, chest pain, palpitations, orthopnea, edema, abdominal pain, nausea, melena, diarrhea, constipation, flank pain, dysuria, hematuria, urinary  Frequency, nocturia, numbness, tingling, seizures,  Focal weakness, Loss of consciousness,  Tremor, insomnia, depression, anxiety, and suicidal ideation.      Objective:  BP 100/60 mmHg  Pulse 73  Temp(Src) 98.1 F (36.7 C) (Oral)  Resp 16  Ht 5' 3.25" (1.607 m)  Wt 111 lb (50.349 kg)  BMI 19.50 kg/m2  SpO2 99%  Physical Exam  General appearance: alert, cooperative and appears stated age Head: Normocephalic, without obvious abnormality, atraumatic Eyes: conjunctivae/corneas clear. PERRL, EOM's intact. Fundi benign. Ears: normal TM's and external ear canals both ears Nose: Nares normal. Septum midline. Mucosa normal. No drainage or sinus tenderness. Throat: lips, mucosa, and tongue normal; teeth  and gums normal Neck: no adenopathy, no carotid bruit, no JVD, supple, symmetrical, trachea midline and thyroid not enlarged, symmetric, no tenderness/mass/nodules Lungs: clear to auscultation bilaterally Breasts: normal appearance, no masses or tenderness Heart: regular rate and rhythm, S1, S2 normal, no murmur, click, rub or gallop Abdomen: soft, non-tender; bowel sounds normal; no masses,  no organomegaly Extremities: extremities normal, atraumatic, no cyanosis or edema Pulses: 2+ and symmetric Skin: Skin color, texture, turgor normal. No rashes or lesions Neurologic: Alert and oriented X 3, normal strength and tone. Normal symmetric reflexes. Normal coordination and gait.    Assessment & Plan:   Problem List Items Addressed This Visit      Unprioritized    Vitamin D deficiency    Prescribing Drisdol for 3 months.       Relevant Orders   Vit D  25 hydroxy (rtn osteoporosis monitoring) (Completed)   Cognitive deficits    She has attributed the deficits to her tumor,  But has not had thyroid function checked, which was done today and is abnormal      Hypothyroidism    New onset , acquired.  Starting levothyroxine       Relevant Medications   levothyroxine (SYNTHROID, LEVOTHROID) 50 MCG tablet   Hyperlipidemia    Based on current lipid profile, the risk of clinically significant CAD is 2.8% over the next 10 years, using the Framingham risk calculator. No therapy is needed       Relevant Orders   Lipid panel (Completed)   Fibrillary astrocytoma    Receiving carboplatin by [facility] with good results  Per patient          Relevant Medications   CARBOplatin in sodium chloride 0.9 % 250 mL   Epilepsy, generalized, convulsive    Seizure frequency has improved with addition of clonazepam      Visit for preventive health examination    Annual wellness  exam was done as well as a comprehensive physical exam  .  During the course of the visit the patient was educated and counseled about appropriate screening and preventive services and screenings were brought up to date for cervical and breast cancer .  She will return for fasting labs to provide samples for diabetes screening and lipid analysis with projected  10 year  risk for CAD. nutrition counseling, skin cancer screening has been recommended, along with review of the age appropriate recommended immunizations.  Printed recommendations for health maintenance screenings was given.         Other Visit Diagnoses    Breast cancer screening    -  Primary    Relevant Orders    MM DIGITAL SCREENING BILATERAL    Other fatigue        Relevant Orders    TSH (Completed)       I have discontinued Ms. Veillon's LORazepam. I have also changed her ergocalciferol to Vitamin D (Ergocalciferol).  Additionally, I am having her start on levothyroxine. Lastly, I am having her maintain her carbamazepine, gabapentin, lomustine, ondansetron, lomustine, citalopram, ibuprofen, clonazePAM, penciclovir, Tdap, folic acid, Cyanocobalamin, docusate, and CARBOplatin in sodium chloride 0.9 % 250 mL.  Meds ordered this encounter  Medications  . CARBOplatin in sodium chloride 0.9 % 250 mL    Sig: Inject into the vein once. Every 3 weeks  . levothyroxine (SYNTHROID, LEVOTHROID) 50 MCG tablet    Sig: Take 1 tablet (50 mcg total) by mouth daily.    Dispense:  90 tablet  Refill:  3  . Vitamin D, Ergocalciferol, (DRISDOL) 50000 UNITS CAPS capsule    Sig: Take 1 capsule (50,000 Units total) by mouth once a week.    Dispense:  12 capsule    Refill:  0    90 day supply    Medications Discontinued During This Encounter  Medication Reason  . LORazepam (ATIVAN) 0.5 MG tablet Change in therapy  . ergocalciferol (DRISDOL) 50000 UNITS capsule Reorder    Follow-up: No Follow-up on file.   Crecencio Mc, MD

## 2015-06-02 LAB — LIPID PANEL
CHOL/HDL RATIO: 4
CHOLESTEROL: 244 mg/dL — AB (ref 0–200)
HDL: 64.4 mg/dL (ref >39.00)
LDL CALC: 158 mg/dL — AB (ref 0–99)
NonHDL: 179.26
Triglycerides: 108 mg/dL (ref 0.0–149.0)
VLDL: 21.6 mg/dL (ref 0.0–40.0)

## 2015-06-02 LAB — TSH: TSH: 11.48 u[IU]/mL — ABNORMAL HIGH (ref 0.35–4.50)

## 2015-06-02 LAB — VITAMIN D 25 HYDROXY (VIT D DEFICIENCY, FRACTURES): VITD: 22.35 ng/mL — ABNORMAL LOW (ref 30.00–100.00)

## 2015-06-03 NOTE — Assessment & Plan Note (Signed)

## 2015-06-03 NOTE — Assessment & Plan Note (Signed)
Receiving carboplatin by [facility] with good results  Per patient

## 2015-06-03 NOTE — Assessment & Plan Note (Signed)
Seizure frequency has improved with addition of clonazepam

## 2015-06-04 DIAGNOSIS — E785 Hyperlipidemia, unspecified: Secondary | ICD-10-CM | POA: Insufficient documentation

## 2015-06-04 DIAGNOSIS — E039 Hypothyroidism, unspecified: Secondary | ICD-10-CM | POA: Insufficient documentation

## 2015-06-04 DIAGNOSIS — R4189 Other symptoms and signs involving cognitive functions and awareness: Secondary | ICD-10-CM | POA: Insufficient documentation

## 2015-06-04 MED ORDER — VITAMIN D (ERGOCALCIFEROL) 1.25 MG (50000 UNIT) PO CAPS: 50000.0000 [IU] | ORAL_CAPSULE | ORAL | Status: AC

## 2015-06-04 MED ORDER — LEVOTHYROXINE SODIUM 50 MCG PO TABS: 50.0000 ug | ORAL_TABLET | Freq: Every day | ORAL | Status: DC

## 2015-06-04 NOTE — Assessment & Plan Note (Signed)
She has attributed the deficits to her tumor,  But has not had thyroid function checked, which was done today and is abnormal

## 2015-06-04 NOTE — Assessment & Plan Note (Signed)
New onset , acquired.  Starting levothyroxine

## 2015-06-04 NOTE — Addendum Note (Signed)
Addended by: Crecencio Mc on: 06/04/2015 05:54 PM   Modules accepted: Level of Service, SmartSet

## 2015-06-04 NOTE — Assessment & Plan Note (Signed)
Based on current lipid profile, the risk of clinically significant CAD is 2.8% over the next 10 years, using the Framingham risk calculator. No therapy is needed

## 2015-06-04 NOTE — Addendum Note (Signed)
Addended by: Crecencio Mc on: 06/04/2015 05:39 PM   Modules accepted: Orders, Level of Service, SmartSet

## 2015-06-04 NOTE — Assessment & Plan Note (Signed)
Prescribing Drisdol for 3 months.

## 2015-06-07 ENCOUNTER — Encounter: Payer: Self-pay | Admitting: *Deleted

## 2015-06-08 ENCOUNTER — Ambulatory Visit: Payer: BC Managed Care – PPO

## 2015-07-20 ENCOUNTER — Other Ambulatory Visit: Payer: BC Managed Care – PPO

## 2015-07-27 ENCOUNTER — Other Ambulatory Visit: Payer: BC Managed Care – PPO

## 2015-07-29 ENCOUNTER — Telehealth: Payer: Self-pay | Admitting: *Deleted

## 2015-07-29 ENCOUNTER — Other Ambulatory Visit (INDEPENDENT_AMBULATORY_CARE_PROVIDER_SITE_OTHER): Payer: Medicare Other

## 2015-07-29 DIAGNOSIS — E034 Atrophy of thyroid (acquired): Secondary | ICD-10-CM

## 2015-07-29 DIAGNOSIS — E038 Other specified hypothyroidism: Secondary | ICD-10-CM

## 2015-07-29 LAB — LDL CHOLESTEROL, DIRECT: Direct LDL: 150 mg/dL — ABNORMAL HIGH (ref <130)

## 2015-07-29 LAB — TSH: TSH: 1.349 u[IU]/mL (ref 0.350–4.500)

## 2015-07-29 NOTE — Telephone Encounter (Signed)
Labs and dx?  

## 2015-08-01 ENCOUNTER — Encounter: Payer: Self-pay | Admitting: *Deleted

## 2016-05-04 ENCOUNTER — Other Ambulatory Visit: Payer: Self-pay | Admitting: Internal Medicine

## 2016-05-23 ENCOUNTER — Ambulatory Visit: Payer: Medicare Other | Attending: Oncology | Admitting: Physical Therapy

## 2016-05-23 DIAGNOSIS — R2681 Unsteadiness on feet: Secondary | ICD-10-CM | POA: Insufficient documentation

## 2016-05-23 DIAGNOSIS — R262 Difficulty in walking, not elsewhere classified: Secondary | ICD-10-CM | POA: Diagnosis present
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
## 2016-05-29 ENCOUNTER — Ambulatory Visit: Payer: Medicare Other

## 2016-05-29 ENCOUNTER — Ambulatory Visit: Payer: Medicare Other | Admitting: Physical Therapy

## 2016-06-01 ENCOUNTER — Ambulatory Visit: Payer: Medicare Other | Admitting: Physical Therapy

## 2016-06-01 ENCOUNTER — Encounter: Payer: Self-pay | Admitting: Physical Therapy

## 2016-06-01 DIAGNOSIS — R2681 Unsteadiness on feet: Secondary | ICD-10-CM

## 2016-06-01 DIAGNOSIS — R262 Difficulty in walking, not elsewhere classified: Secondary | ICD-10-CM
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
## 2016-06-05 ENCOUNTER — Ambulatory Visit: Payer: Medicare Other | Attending: Oncology | Admitting: Physical Therapy

## 2016-06-05 ENCOUNTER — Encounter: Payer: Self-pay | Admitting: Physical Therapy

## 2016-06-05 DIAGNOSIS — R2681 Unsteadiness on feet: Secondary | ICD-10-CM | POA: Diagnosis not present

## 2016-06-05 DIAGNOSIS — R262 Difficulty in walking, not elsewhere classified: Secondary | ICD-10-CM | POA: Insufficient documentation
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
## 2016-06-07 ENCOUNTER — Ambulatory Visit: Payer: Medicare Other

## 2016-06-07 DIAGNOSIS — R262 Difficulty in walking, not elsewhere classified: Secondary | ICD-10-CM

## 2016-06-07 DIAGNOSIS — R2681 Unsteadiness on feet: Secondary | ICD-10-CM | POA: Diagnosis not present
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
## 2016-06-19 ENCOUNTER — Ambulatory Visit: Payer: Medicare Other

## 2016-06-19 DIAGNOSIS — R2681 Unsteadiness on feet: Secondary | ICD-10-CM | POA: Diagnosis not present

## 2016-06-19 DIAGNOSIS — R262 Difficulty in walking, not elsewhere classified: Secondary | ICD-10-CM
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
## 2016-06-21 ENCOUNTER — Ambulatory Visit: Payer: Medicare Other

## 2016-06-21 DIAGNOSIS — R2681 Unsteadiness on feet: Secondary | ICD-10-CM

## 2016-06-21 DIAGNOSIS — R262 Difficulty in walking, not elsewhere classified: Secondary | ICD-10-CM

## 2016-06-21 NOTE — Patient Instructions (Signed)
[email or web address] bil tandem stance at kitchen sink with husband for assistance bil SLS at kitchen sink with husband for assistance  3x10 of 30 sec holds each, 1x/day
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
## 2016-06-25 ENCOUNTER — Ambulatory Visit: Payer: Medicare Other

## 2016-06-28 ENCOUNTER — Ambulatory Visit: Payer: Medicare Other

## 2016-07-02 ENCOUNTER — Ambulatory Visit: Payer: Medicare Other

## 2016-07-05 ENCOUNTER — Ambulatory Visit: Payer: Medicare Other

## 2016-07-10 ENCOUNTER — Ambulatory Visit: Payer: Medicare Other

## 2016-07-11 ENCOUNTER — Telehealth: Payer: Self-pay | Admitting: Internal Medicine

## 2016-07-11 NOTE — Telephone Encounter (Signed)
Please advise 

## 2016-07-11 NOTE — Telephone Encounter (Signed)
Erin from [place] care called asking to speak with Juliann Pulse regarding the patient receiving and order for Occupational therapy for upper body strength. She is receiving physical therapy now after a hospital stay.

## 2016-07-12 NOTE — Telephone Encounter (Signed)
Ok to give verbal 

## 2016-07-12 NOTE — Telephone Encounter (Signed)
Verbal order given to Erin.  

## 2016-07-12 NOTE — Telephone Encounter (Signed)
Verbal order for OT authorized

## 2016-07-12 NOTE — Telephone Encounter (Signed)
Verbal given 

## 2016-07-17 ENCOUNTER — Telehealth: Payer: Self-pay | Admitting: *Deleted

## 2016-07-17 NOTE — Telephone Encounter (Signed)
Gina Cross from [facility] care stated that occupational Therapy will be delayed until the week of the 03-16-23 , due to a death in the family.

## 2016-07-17 NOTE — Telephone Encounter (Signed)
Noted thanks, will send to PCP as a FYI. thanks

## 2016-07-26 ENCOUNTER — Emergency Department
Admission: EM | Admit: 2016-07-26 | Discharge: 2016-07-26 | Disposition: A | Discharge status: Home / Self Care | Payer: Medicare Other | Attending: Emergency Medicine | Admitting: Emergency Medicine

## 2016-07-26 ENCOUNTER — Encounter: Payer: Self-pay | Admitting: Emergency Medicine

## 2016-07-26 ENCOUNTER — Emergency Department: Payer: Medicare Other

## 2016-07-26 DIAGNOSIS — I82411 Acute embolism and thrombosis of right femoral vein: Secondary | ICD-10-CM

## 2016-07-26 DIAGNOSIS — I82491 Acute embolism and thrombosis of other specified deep vein of right lower extremity: Secondary | ICD-10-CM | POA: Insufficient documentation

## 2016-07-26 DIAGNOSIS — E039 Hypothyroidism, unspecified: Secondary | ICD-10-CM | POA: Insufficient documentation

## 2016-07-26 DIAGNOSIS — M7989 Other specified soft tissue disorders: Secondary | ICD-10-CM | POA: Diagnosis present

## 2016-07-26 HISTORY — DX: Neoplasm of unspecified behavior of brain: D49.6

## 2016-07-26 LAB — CBC WITH DIFFERENTIAL/PLATELET
BASOS ABS: 0 10*3/uL (ref 0–0.1)
BASOS PCT: 1 %
EOS PCT: 1 %
Eosinophils Absolute: 0 10*3/uL (ref 0–0.7)
HEMATOCRIT: 39.1 % (ref 35.0–47.0)
Hemoglobin: 13.6 g/dL (ref 12.0–16.0)
LYMPHS PCT: 15 %
Lymphs Abs: 0.7 10*3/uL — ABNORMAL LOW (ref 1.0–3.6)
MCH: 34.9 pg — ABNORMAL HIGH (ref 26.0–34.0)
MCHC: 34.8 g/dL (ref 32.0–36.0)
MCV: 100.4 fL — AB (ref 80.0–100.0)
Monocytes Absolute: 0.4 10*3/uL (ref 0.2–0.9)
Monocytes Relative: 7 %
NEUTROS ABS: 3.7 10*3/uL (ref 1.4–6.5)
Neutrophils Relative %: 76 %
PLATELETS: 262 10*3/uL (ref 150–440)
RBC: 3.9 MIL/uL (ref 3.80–5.20)
RDW: 12.4 % (ref 11.5–14.5)
WBC: 4.9 10*3/uL (ref 3.6–11.0)

## 2016-07-26 LAB — COMPREHENSIVE METABOLIC PANEL
ALBUMIN: 3.6 g/dL (ref 3.5–5.0)
ALT: 16 U/L (ref 14–54)
AST: 23 U/L (ref 15–41)
Alkaline Phosphatase: 112 U/L (ref 38–126)
Anion gap: 5 (ref 5–15)
BUN: 10 mg/dL (ref 6–20)
CHLORIDE: 104 mmol/L (ref 101–111)
CO2: 30 mmol/L (ref 22–32)
CREATININE: 0.94 mg/dL (ref 0.44–1.00)
Calcium: 9 mg/dL (ref 8.9–10.3)
GFR calc Af Amer: >60 mL/min (ref >60)
GLUCOSE: 87 mg/dL (ref 65–99)
POTASSIUM: 4.7 mmol/L (ref 3.5–5.1)
Sodium: 139 mmol/L (ref 135–145)
Total Bilirubin: 0.9 mg/dL (ref 0.3–1.2)
Total Protein: 7.5 g/dL (ref 6.5–8.1)

## 2016-07-26 MED ORDER — PIPERACILLIN-TAZOBACTAM 3.375 G IVPB 30 MIN: 3.3750 g | Freq: Once | INTRAVENOUS | Status: DC

## 2016-07-26 MED ORDER — ENOXAPARIN SODIUM 60 MG/0.6ML ~~LOC~~ SOLN
1.0000 mg/kg | Freq: Once | SUBCUTANEOUS | Status: AC
  Administered 2016-07-26: 50 mg via SUBCUTANEOUS
  Filled 2016-07-26: qty 0.6

## 2016-07-26 MED ORDER — RIVAROXABAN (XARELTO) VTE STARTER PACK (15 & 20 MG): ORAL_TABLET | ORAL | 0 refills | Status: DC

## 2016-07-26 NOTE — ED Notes (Signed)
Pt resting comfortably at this time.  Family at bedside.  No needs or concerns at this time.  Pt educated to use call bell for any needs.  Verbalized understanding.

## 2016-07-26 NOTE — Discharge Instructions (Signed)
Please return immediately if condition worsens. Please contact her primary physician or the physician you were given for referral. If you have any specialist physicians involved in her treatment and plan please also contact them. Thank you for using [facility] emergency [facility]. ° °
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
## 2016-07-26 NOTE — ED Triage Notes (Signed)
Pt c/o swelling to R leg since last Sunday. Pt's husband states this past Tuesday that her at home physical therapist came to see her and noted temperature differences to her legs. Pt states she takes Avastin for a brain tumor and one of the side effects is potentially a blood. Pt's feet are noted to be motled bilaterally, swelling noted to R calf, with warmth noted proximal to the knee. Pt is alert and oriented. Pt is wheelchair bound at this time.

## 2016-07-26 NOTE — ED Notes (Signed)
This RN attempted to doppler pulses on both feet, was unsuccessful, cap refill noted to be > 3 bilaterally. MD made aware at this time.

## 2016-07-27 ENCOUNTER — Telehealth: Payer: Self-pay | Admitting: *Deleted

## 2016-07-27 NOTE — Telephone Encounter (Signed)
Spoke with husband and they have enough meds, thanks

## 2016-07-27 NOTE — Telephone Encounter (Signed)
FYI, has appt on 9/26.  Thanks

## 2016-07-27 NOTE — Telephone Encounter (Signed)
Does not need an earlier appointment. Confirm that she was prescribed adequate amount of rivaroxaban to continue taking until then.

## 2016-07-27 NOTE — Telephone Encounter (Signed)
Patient husband wanted to [facility] Dr. Derrel Nip that pt was seen in the ER on 09/21. She was diagnosed with a blood clot in her right leg. She was advised to be seen by her PCP for a follow up. She;s scheduled for 09/26, she was unable to come in on 09/25 due to appt at [facility] .  If pt need a sooner appt please give time for today.

## 2016-07-31 ENCOUNTER — Ambulatory Visit (INDEPENDENT_AMBULATORY_CARE_PROVIDER_SITE_OTHER): Payer: Medicare Other | Admitting: Internal Medicine

## 2016-07-31 VITALS — BP 94/64 | HR 67 | Temp 97.7°F | Resp 12 | Wt 114.5 lb

## 2016-07-31 DIAGNOSIS — R634 Abnormal weight loss: Secondary | ICD-10-CM

## 2016-07-31 DIAGNOSIS — I82411 Acute embolism and thrombosis of right femoral vein: Secondary | ICD-10-CM

## 2016-07-31 DIAGNOSIS — E034 Atrophy of thyroid (acquired): Secondary | ICD-10-CM | POA: Diagnosis not present

## 2016-07-31 DIAGNOSIS — E038 Other specified hypothyroidism: Secondary | ICD-10-CM

## 2016-07-31 DIAGNOSIS — R5383 Other fatigue: Secondary | ICD-10-CM

## 2016-07-31 DIAGNOSIS — E538 Deficiency of other specified B group vitamins: Secondary | ICD-10-CM | POA: Diagnosis not present

## 2016-07-31 NOTE — Progress Notes (Signed)
Subjective:  Patient ID: Gina Cross, female    DOB: Dec 26, 1960  Age: 55 y.o. MRN: [id number]  CC: The primary encounter diagnosis was B12 deficiency. Diagnoses of Hypothyroidism due to acquired atrophy of thyroid, Acute deep vein thrombosis (DVT) of femoral vein of right lower extremity ([place]), Lethargy, and Loss of weight were also pertinent to this visit.  HPI Gina Cross presents for recent diagnosis of RLE DVT, diagnosed Sept 21 .  Treated in ED after Physical therapist notice temperature varatio nin legs .  Right femoral vein DCT diagnosed  At [facility],  Prescribed Xarelto.  After receiving a weight based dose of  Of Lovenox  by Dr Nile Riggs in [facility].  avastin is shrinking some of the new spots seen on July MRI?PET, but center of brain is swelling and causing lethargy,  Now on dexamethasone 4 mg bid.  Clonazepam dose has been reduce to once daily in the evening . (used to prevent seizures) .  Has reduced the gabapentin as well so may increase that to offset the reduction in clonazepam.  Has follow up on Thursday   Needs thyroid med refilled,  Last tsh was one year ago . To be checked today     Outpatient Medications Prior to Visit  Medication Sig Dispense Refill  . carbamazepine (TEGRETOL XR) 200 MG 12 hr tablet Take 3 tablets by mouth daily.    . citalopram (CELEXA) 20 MG tablet Take 20 mg by mouth daily.    [name] gabapentin (NEURONTIN) 400 MG capsule Take 3 capsules by mouth every am and 2 capsules three times a day for a ttal of 9 capsules    . ibuprofen (ADVIL,MOTRIN) 200 MG tablet Take 800 mg by mouth every 6 (six) hours as needed for pain.    [name] ondansetron (ZOFRAN) 8 MG tablet Take 1 tablet by mouth 1 hour before lomustine on day 1 and every 8 hours as needed for nausea.    [name] penciclovir (DENAVIR) 1 % cream Apply 1 application topically as needed.    . Rivaroxaban 15 & 20 MG TBPK Take as directed on package: Start with one 15mg  tablet by mouth twice a day with food. On  Day 22, switch to one 20mg  tablet once a day with food. 51 each 0  . levothyroxine (SYNTHROID, LEVOTHROID) 50 MCG tablet TAKE 1 TABLET BY MOUTH EVERY DAY 90 tablet 0  . CARBOplatin in sodium chloride 0.9 % 250 mL Inject into the vein once. Every 3 weeks    . clonazePAM (KLONOPIN) 0.5 MG tablet Take 1 tablet by mouth 2 (two) times daily.    . Cyanocobalamin 1000 MCG SUBL Place 1 tablet (1,000 mcg total) under the tongue daily. (Patient not taking: Reported on 07/31/2016) 90 tablet 3  . docusate (COLACE) 50 MG/5ML liquid Take 5 mLs (50 mg total) by mouth daily. Instill a few drops into each ear nightly for cerumen impaction (Patient not taking: Reported on 123456) 123XX123 mL 0  . folic acid (FOLVITE) 1 MG tablet Take 1 tablet (1 mg total) by mouth daily. (Patient not taking: Reported on 07/31/2016) 90 tablet 1  . lomustine (CEENU) 10 MG capsule Take 3 capsules with 100mg  (total dose of 1430mg ) by mouth at night on day 1 of 6 day cycle.    . lomustine (CEENU) 100 MG capsule Take 1 capsule with 30mg  (total dose 130mg ) by mouth at night on day 1 of 6 day cycle.    . Tdap (  BOOSTRIX) 5-2.5-18.5 LF-MCG/0.5 injection Inject 0.5 mLs into the muscle once. (Patient not taking: Reported on 07/31/2016) 0.5 mL 0  . Vitamin D, Ergocalciferol, (DRISDOL) 50000 UNITS CAPS capsule Take 1 capsule (50,000 Units total) by mouth once a week. (Patient not taking: Reported on 07/31/2016) 12 capsule 0   No facility-administered medications prior to visit.     Review of Systems;  Patient denies headache, fevers, malaise, unintentional weight loss, skin rash, eye pain, sinus congestion and sinus pain, sore throat, dysphagia,  hemoptysis , cough, dyspnea, wheezing, chest pain, palpitations, orthopnea, edema, abdominal pain, nausea, melena, diarrhea, constipation, flank pain, dysuria, hematuria, urinary  Frequency, nocturia, numbness, tingling, seizures,  Focal weakness, Loss of consciousness,  Tremor, insomnia, depression, anxiety,  and suicidal ideation.      Objective:  BP 94/64   Pulse 67   Temp 97.7 F (36.5 C) (Oral)   Resp 12   Wt 114 lb 8 oz (51.9 kg)   LMP 11/07/2012   SpO2 99%   BMI 20.28 kg/m   BP Readings from Last 3 Encounters:  07/31/16 94/64  07/26/16 108/81  06/01/15 100/60    Wt Readings from Last 3 Encounters:  07/31/16 114 lb 8 oz (51.9 kg)  07/26/16 115 lb (52.2 kg)  06/01/15 111 lb (50.3 kg)    General appearance: alert, cooperative and appears older than stated age Neck: no adenopathy, no carotid bruit, supple, symmetrical, trachea midline and thyroid not enlarged, symmetric, no tenderness/mass/nodules Back: symmetric, no curvature. ROM normal. No CVA tenderness. Lungs: clear to auscultation bilaterally Heart: regular rate and rhythm, S1, S2 normal, no murmur, click, rub or gallop Abdomen: soft, non-tender; bowel sounds normal; no masses,  no organomegaly Pulses: 2+ and symmetric Skin: Skin color, texture, turgor normal. No rashes or lesions Ext: minimal swelling and redness of right lower extremity.  Lymph nodes: Cervical, supraclavicular, and axillary nodes normal.  No results found for: HGBA1C  Lab Results  Component Value Date   CREATININE 0.94 07/26/2016   CREATININE 0.6 02/17/2014   CREATININE 0.61 08/19/2013    Lab Results  Component Value Date   WBC 4.9 07/26/2016   HGB 13.6 07/26/2016   HCT 39.1 07/26/2016   PLT 262 07/26/2016   GLUCOSE 87 07/26/2016   CHOL 244 (H) 06/01/2015   TRIG 108.0 06/01/2015   HDL 64.40 06/01/2015   LDLDIRECT 150 (H) 07/29/2015   LDLCALC 158 (H) 06/01/2015   ALT 16 07/26/2016   AST 23 07/26/2016   NA 139 07/26/2016   K 4.7 07/26/2016   CL 104 07/26/2016   CREATININE 0.94 07/26/2016   BUN 10 07/26/2016   CO2 30 07/26/2016   TSH 1.26 07/31/2016    US Venous Img Lower Bilateral  Result Date: 07/26/2016 CLINICAL DATA:  Leg swelling, right greater than left for 3 days. EXAM: BILATERAL LOWER EXTREMITY VENOUS DOPPLER  ULTRASOUND TECHNIQUE: Gray-scale sonography with graded compression, as well as color Doppler and duplex ultrasound were performed to evaluate the lower extremity deep venous systems from the level of the common femoral vein and including the common femoral, femoral, profunda femoral, popliteal and calf veins including the posterior tibial, peroneal and gastrocnemius veins when visible. The superficial great saphenous vein was also interrogated. Spectral Doppler was utilized to evaluate flow at rest and with distal augmentation maneuvers in the common femoral, femoral and popliteal veins. COMPARISON:  None. FINDINGS: RIGHT LOWER EXTREMITY Common Femoral Vein: No evidence of thrombus. Normal compressibility, respiratory phasicity and response to augmentation. Saphenofemoral Junction: No evidence  of thrombus. Normal compressibility and flow on color Doppler imaging. Profunda Femoral Vein: No evidence of thrombus. Normal compressibility and flow on color Doppler imaging. Femoral Vein: Nonocclusive thrombus identified in the proximal and mid femoral vein becomes occlusive in the distal femoral vein. Popliteal Vein: Positive for DVT. Calf Veins: Positive for DVT. Superficial Great Saphenous Vein: No evidence of thrombus. Normal compressibility and flow on color Doppler imaging. LEFT LOWER EXTREMITY Common Femoral Vein: No evidence of thrombus. Normal compressibility, respiratory phasicity and response to augmentation. Saphenofemoral Junction: No evidence of thrombus. Normal compressibility and flow on color Doppler imaging. Profunda Femoral Vein: No evidence of thrombus. Normal compressibility and flow on color Doppler imaging. Femoral Vein: No evidence of thrombus. Normal compressibility, respiratory phasicity and response to augmentation. Popliteal Vein: No evidence of thrombus. Normal compressibility, respiratory phasicity and response to augmentation. Calf Veins: No evidence of thrombus. Normal compressibility and  flow on color Doppler imaging. Superficial Great Saphenous Vein: No evidence of thrombus. Normal compressibility and flow on color Doppler imaging. IMPRESSION: Nonocclusive thrombus identified in the proximal and mid right femoral vein becomes occlusive in the distal femoral vein and popliteal vein. Critical Value/emergent results were called by telephone at the time of interpretation on 07/26/2016 at 7:09 pm to Dr. Meade Maw , who verbally acknowledged these results. Electronically Signed   By: Misty Stanley M.D.   On: 07/26/2016 19:09    Assessment & Plan:   Problem List Items Addressed This Visit    Hypothyroidism    Thyroid function is WNL on current dose.  No current changes needed.  Medication refilled.  Lab Results  Component Value Date   TSH 1.26 07/31/2016         Relevant Medications   levothyroxine (SYNTHROID, LEVOTHROID) 50 MCG tablet   Other Relevant Orders   TSH (Completed)   B12 deficiency - Primary    Repeat level is low .  Advised to resume B12 injections,  Daily x 4,  Weekly x 4 then monthly       Relevant Orders   B12 (Completed)   Folate RBC (Completed)   DVT (deep venous thrombosis) ([place])    Confirmed by US done on Sept 21.  Secondary to decreased mobility and use of Avastin for treatment of astrocytoma.  Tolerating Eliquis .  Elevating leg to manage mild edema.  Anticoagulation ma be advised indefinitely given patient's prognosis for recovery of mobility       Lethargy    Her clonazepam and gabapentin doses have been reduced to increase her daytime alertness.        Loss of weight     Secondary to anorexia and sedation.  I have reviewed her diet and recommended that she increase her protein and fat intake .  Protein shakes recommended        Other Visit Diagnoses   None.     I have changed Ms. Duda's levothyroxine. I am also having her start on cyanocobalamin and Syringe (Disposable). Additionally, I am having her maintain her carbamazepine,  gabapentin, lomustine, ondansetron, lomustine, citalopram, ibuprofen, clonazePAM, penciclovir, Tdap, folic acid, Cyanocobalamin, docusate, CARBOplatin in sodium chloride 0.9 % 250 mL, Vitamin D (Ergocalciferol), and Rivaroxaban.  Meds ordered this encounter  Medications  . levothyroxine (SYNTHROID, LEVOTHROID) 50 MCG tablet    Sig: Take 1 tablet (50 mcg total) by mouth daily.    Dispense:  90 tablet    Refill:  0    Patient needs appointment for further refills.  . cyanocobalamin (,  VITAMIN B-12,) 1000 MCG/ML injection    Sig: 1 ml injected intramuscularly  Daily for 4 days, then weekly for 4 weeks , then monthly    Dispense:  10 mL    Refill:  2  . Syringe, Disposable, 3 ML MISC    Sig: For use with B12 injections weekly/monthly    Dispense:  25 each    Refill:  1   A total of 25 minutes of face to face time was spent with patient more than half of which was spent in counselling about the above mentioned conditions  and coordination of care   Medications Discontinued During This Encounter  Medication Reason  . levothyroxine (SYNTHROID, LEVOTHROID) 50 MCG tablet Reorder    Follow-up: No Follow-up on file.   Crecencio Mc, MD

## 2016-07-31 NOTE — Progress Notes (Signed)
Pre-visit discussion using our clinic review tool. No additional management support is needed unless otherwise documented below in the visit note.  

## 2016-07-31 NOTE — Patient Instructions (Addendum)
  You might want to try a premixed protein drink called Premier Protein shake for breakfast or late night snack . It is great tasting,   very low sugar and available of < $2 serving at [facility] and  In bulk for $1.50/serving at [employer] and [employer]  .    Nutritional analysis :  160 cal  30 g protein  1 g sugar 50% calcium needs   [employer] and [employer]  Try the caramel  Vanilla and chocolate.     Elevate your legs as much as possible to manage any swelling .   Ask your oncologist about getting the flu vaccine this year.  Let us know if they give it to you.  If they want you to get it done here,  You can return anytime.

## 2016-08-01 LAB — TSH: TSH: 1.26 u[IU]/mL (ref 0.35–4.50)

## 2016-08-01 LAB — FOLATE RBC: RBC Folate: 385 ng/mL (ref >280)

## 2016-08-01 LAB — VITAMIN B12: Vitamin B-12: 260 pg/mL (ref 211–911)

## 2016-08-02 DIAGNOSIS — I82409 Acute embolism and thrombosis of unspecified deep veins of unspecified lower extremity: Secondary | ICD-10-CM | POA: Insufficient documentation

## 2016-08-02 DIAGNOSIS — R634 Abnormal weight loss: Secondary | ICD-10-CM | POA: Insufficient documentation

## 2016-08-02 DIAGNOSIS — R5383 Other fatigue: Secondary | ICD-10-CM | POA: Insufficient documentation

## 2016-08-02 MED ORDER — SYRINGE (DISPOSABLE) 3 ML MISC: 1 refills | Status: AC

## 2016-08-02 MED ORDER — LEVOTHYROXINE SODIUM 50 MCG PO TABS: 50.0000 ug | ORAL_TABLET | Freq: Every day | ORAL | 0 refills | Status: DC

## 2016-08-02 MED ORDER — CYANOCOBALAMIN 1000 MCG/ML IJ SOLN: INTRAMUSCULAR | 2 refills | Status: AC

## 2016-08-02 NOTE — Assessment & Plan Note (Signed)
Thyroid function is WNL on current dose.  No current changes needed.  Medication refilled.  Lab Results  Component Value Date   TSH 1.26 07/31/2016

## 2016-08-02 NOTE — Assessment & Plan Note (Signed)
Repeat level is low .  Advised to resume B12 injections,  Daily x 4,  Weekly x 4 then monthly

## 2016-08-02 NOTE — Assessment & Plan Note (Signed)
Her clonazepam and gabapentin doses have been reduced to increase her daytime alertness.

## 2016-08-02 NOTE — Assessment & Plan Note (Signed)
Secondary to anorexia and sedation.  I have reviewed her diet and recommended that she increase her protein and fat intake .  Protein shakes recommended

## 2016-08-02 NOTE — Assessment & Plan Note (Addendum)
Confirmed by US done on Sept 21.  Secondary to decreased mobility and use of Avastin for treatment of astrocytoma.  Tolerating Eliquis .  Elevating leg to manage mild edema.  Anticoagulation ma be advised indefinitely given patient's prognosis for recovery of mobility

## 2016-08-03 ENCOUNTER — Other Ambulatory Visit: Payer: Self-pay

## 2016-08-03 MED ORDER — LEVOTHYROXINE SODIUM 50 MCG PO TABS: 50.0000 ug | ORAL_TABLET | Freq: Every day | ORAL | 0 refills | Status: AC

## 2016-08-06 ENCOUNTER — Telehealth: Payer: Self-pay | Admitting: *Deleted

## 2016-08-06 NOTE — Telephone Encounter (Signed)
Marjory Lies from [place] requested verbal order for extended physical therapy for 2 times a week for 3 weeks  Contact [phone]

## 2016-08-07 NOTE — Telephone Encounter (Signed)
Verbal given. FYI 

## 2016-08-27 ENCOUNTER — Telehealth: Payer: Self-pay | Admitting: *Deleted

## 2016-08-27 ENCOUNTER — Other Ambulatory Visit: Payer: Self-pay | Admitting: Internal Medicine

## 2016-08-27 MED ORDER — RIVAROXABAN 20 MG PO TABS: 20.0000 mg | ORAL_TABLET | Freq: Every day | ORAL | 5 refills | Status: AC

## 2016-08-27 NOTE — Telephone Encounter (Signed)
Given by Historical provider can I fill?

## 2016-08-27 NOTE — Telephone Encounter (Signed)
Patient's husband requested a medication refill for Rivaroxaban  Pharmacy [facility] [address]

## 2016-08-27 NOTE — Telephone Encounter (Signed)
rx required re writing , for 2nd month dosing . Done and sent

## 2016-10-04 ENCOUNTER — Telehealth: Payer: Self-pay | Admitting: Internal Medicine

## 2016-10-04 NOTE — Telephone Encounter (Signed)
Please call.

## 2016-10-04 NOTE — Telephone Encounter (Signed)
Spoke with Ivin Booty at [place] and gave verbal order per below.

## 2016-10-04 NOTE — Telephone Encounter (Signed)
Please advise 

## 2016-10-04 NOTE — Telephone Encounter (Signed)
Yes I will sign the oder for hospice

## 2016-10-04 NOTE — Telephone Encounter (Signed)
Denise from [place] and they received a referral from [facility] for pt and would like to know if Dr. Derrel Nip would sign the order and to get a verbal order before Dr. Derrel Nip can sign. Please advise, thank you!  Call @ [phone]

## 2016-10-08 ENCOUNTER — Telehealth: Payer: Self-pay | Admitting: Internal Medicine

## 2016-10-08 NOTE — Telephone Encounter (Signed)
Pt husband  Has a  question about morphine that his wife was prescribed  By hospice.. Please advise at [phone]

## 2016-10-08 NOTE — Telephone Encounter (Signed)
Nausea and vomiting is not an allergy,  So it is safe to try morphine .  The hospice nurse will let me know if it is not tolerated.

## 2016-10-08 NOTE — Telephone Encounter (Signed)
Tried calling patient --no answer

## 2016-10-08 NOTE — Telephone Encounter (Signed)
Spoke with husband Clair Gulling and he is concerned that since patient has allergy to codeine of nausea and vomiting that she might have problem with morphine please advise.

## 2016-10-09 ENCOUNTER — Telehealth: Payer: Self-pay | Admitting: Internal Medicine

## 2016-10-09 NOTE — Telephone Encounter (Signed)
Patients husband advised of below  

## 2016-10-09 NOTE — Telephone Encounter (Signed)
Left message for patient's husband to call.

## 2016-10-09 NOTE — Telephone Encounter (Signed)
Pt spouse called returning your call. Thank you!  Call Jim @ [phone]

## 2016-10-09 NOTE — Telephone Encounter (Signed)
See message dated 10/08/16  for call documentation

## 2016-10-19 ENCOUNTER — Telehealth: Payer: Self-pay | Admitting: *Deleted

## 2016-10-19 DIAGNOSIS — R300 Dysuria: Secondary | ICD-10-CM

## 2016-10-19 NOTE — Telephone Encounter (Signed)
Vertie RN [phone] calls requesting order for urine specimen, she states patient having urine retention, hasn't urinated for 15 hours and has blood in it.  Please advise

## 2016-10-19 NOTE — Telephone Encounter (Signed)
Spoke with [facility] and gave order per below.  She checked on patient and she has urinated today and patient has urinated today [facility] checked on patient today.

## 2016-10-19 NOTE — Telephone Encounter (Signed)
Family called hospice to request a Urine specimen, pt was having urine retention on 12/14. Family reported that pt had dark urine that could possible have blood in it. Vertie the hospice nurse requested verbal order to have a urine specimen ordered.  Contact Vertie  [phone]

## 2016-10-19 NOTE — Telephone Encounter (Signed)
Please obtain a urine sample for UA and culture  Please perform an in and out cath if patient has not urinated in 8 hours

## 2016-10-23 ENCOUNTER — Telehealth: Payer: Self-pay | Admitting: Internal Medicine

## 2016-10-23 MED ORDER — HYDROMORPHONE HCL 1 MG/ML PO LIQD: 1.0000 mg | ORAL | 0 refills | Status: DC | PRN

## 2016-10-23 MED ORDER — HYDROMORPHONE HCL 1 MG/ML PO LIQD: 1.0000 mg | ORAL | 0 refills | Status: AC | PRN

## 2016-10-23 NOTE — Telephone Encounter (Signed)
Can change,  but in the future please have Garlon Hatchet screen and anticipate that I will need to ask for the dose that Hospice wants me to prescribe. I chose the middle dose to  save time

## 2016-10-23 NOTE — Telephone Encounter (Signed)
Gina Cross from Hospice called. Pt is allergie codeine and would like if Dr. Derrel Nip would changed medication to a liquid, dilautid. Please advise phone [phone].

## 2016-10-23 NOTE — Telephone Encounter (Signed)
Placed in quick sign,

## 2016-10-23 NOTE — Telephone Encounter (Signed)
Called and notified Gina Cross of dose prescribed and the dose is what was recommended by hospice. [facility] [address]

## 2016-10-29 IMAGING — US US EXTREM LOW VENOUS BILAT
1 series · 13 of 24 positions shown · non-contrast
Comparison: None.

CLINICAL DATA: Leg swelling, right greater than left for 3 days.



[Series 1: us extrem low venous bilat · 0.08mm/px · 13 of 61 slices shown]
[im 1/61]
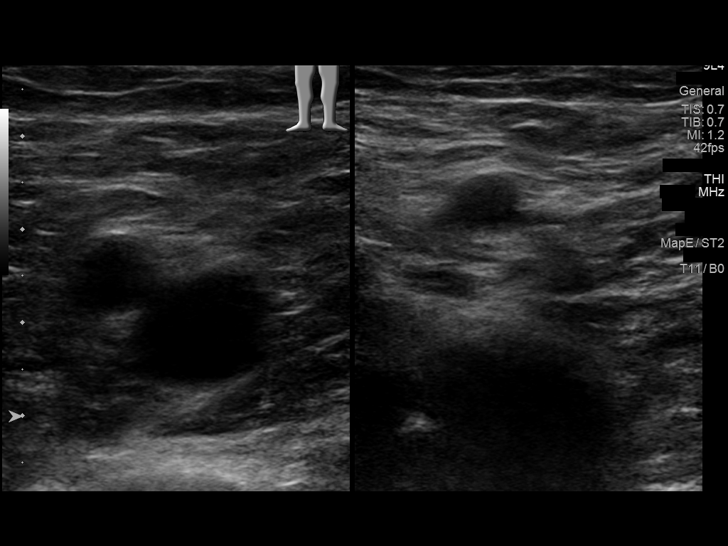
[im 6/61]
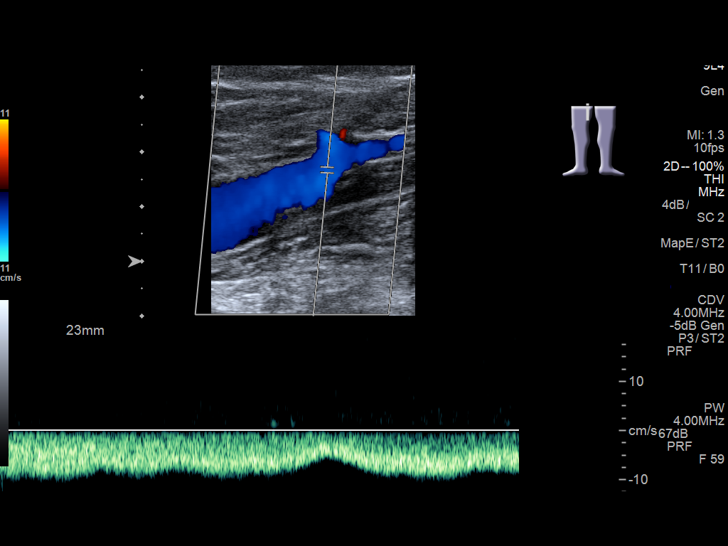
[im 11/61]
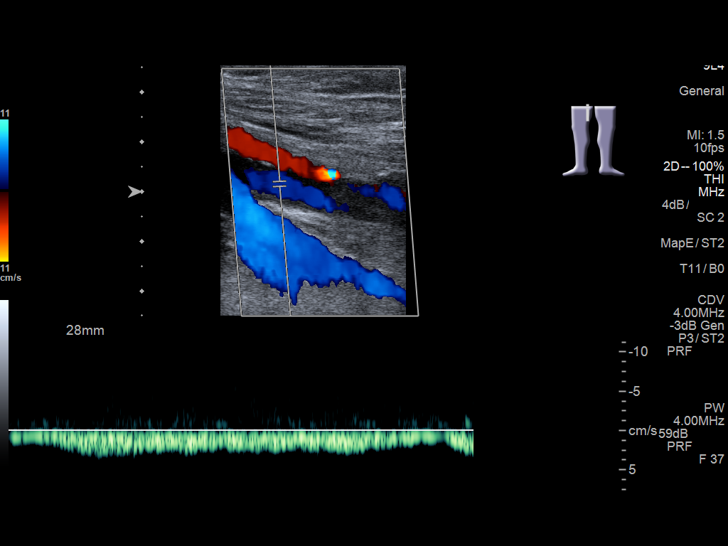
[im 16/61]
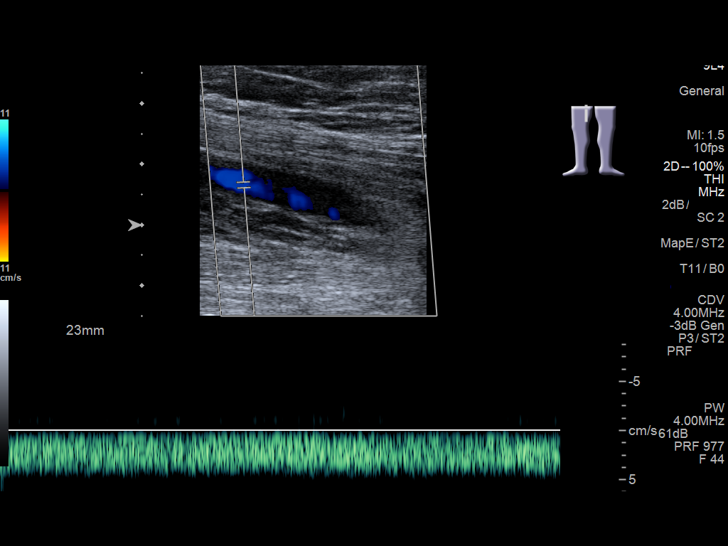
[im 21/61]
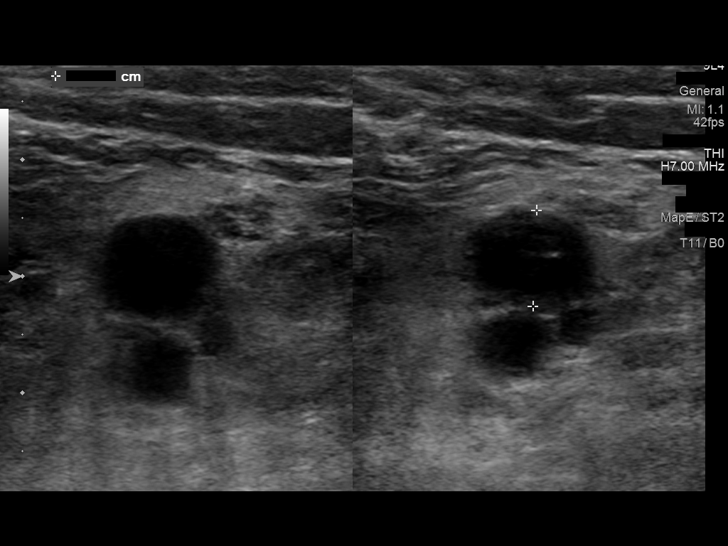
[im 27/61]
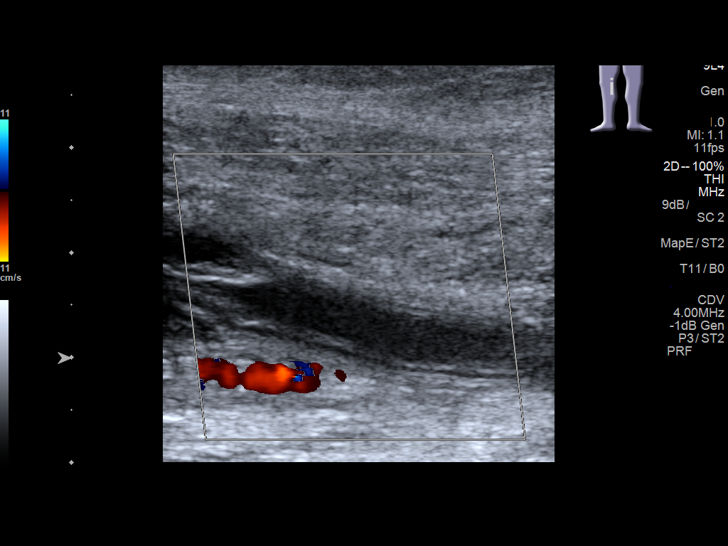
[im 32/61]
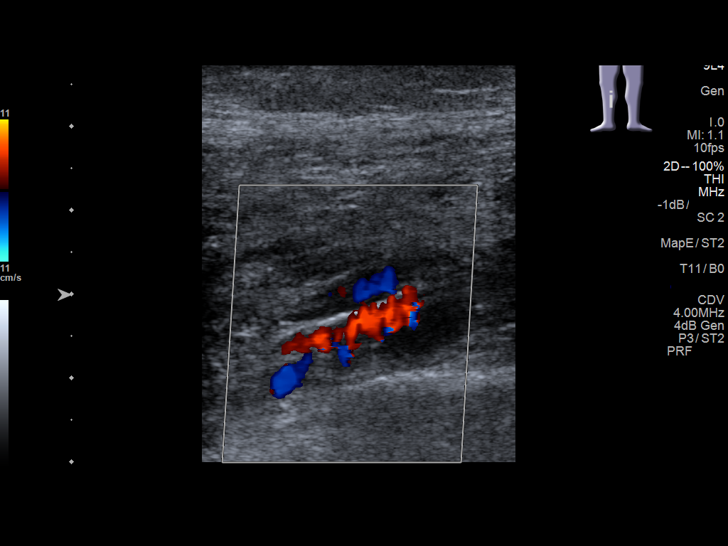
[im 34/61]
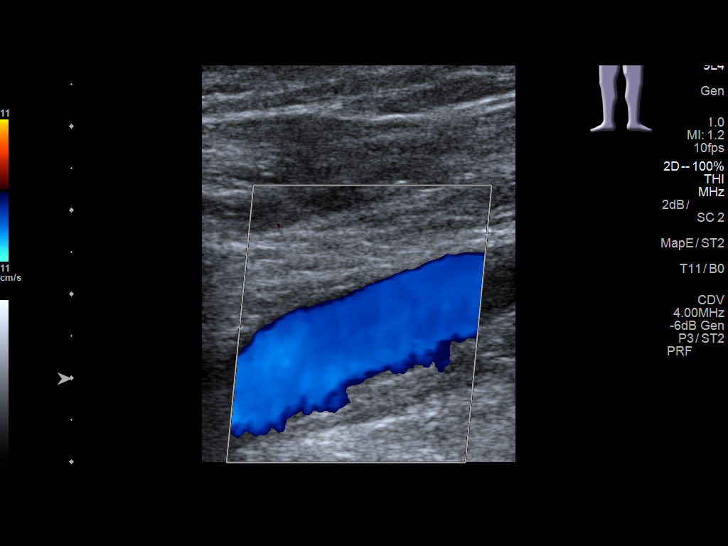
[im 40/61]
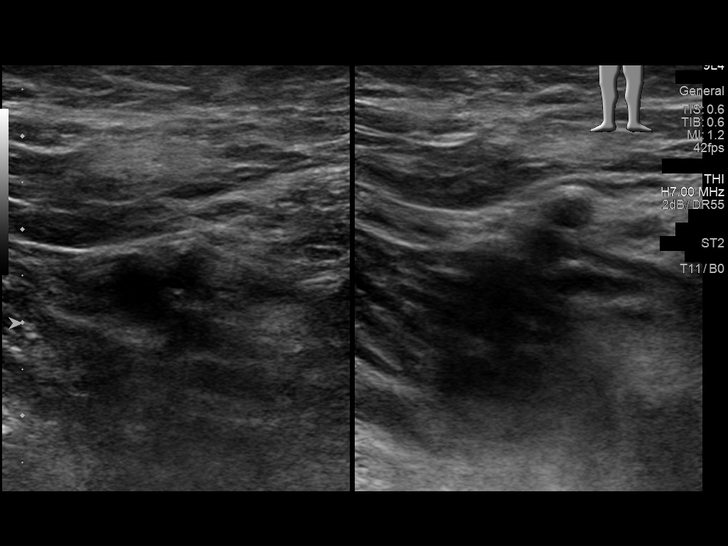
[im 45/61]
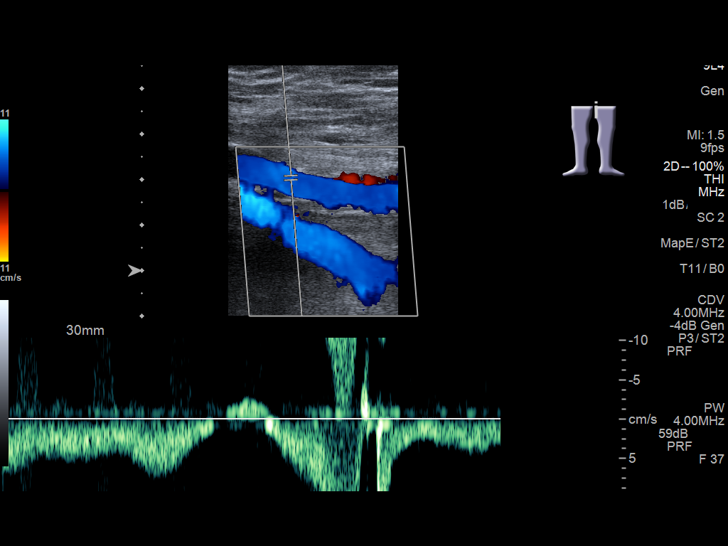
[im 50/61]
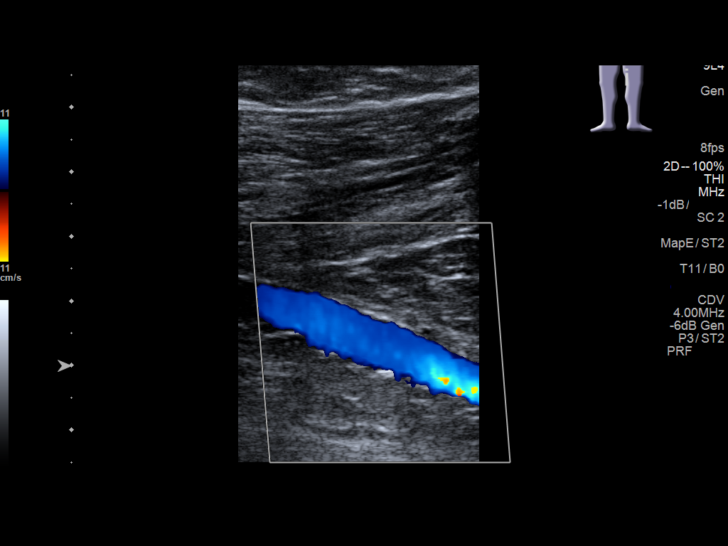
[im 55/61]
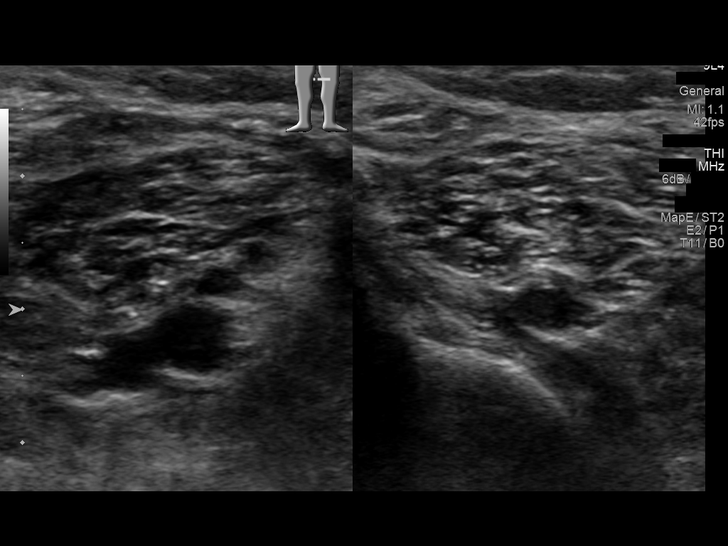
[im 61/61]
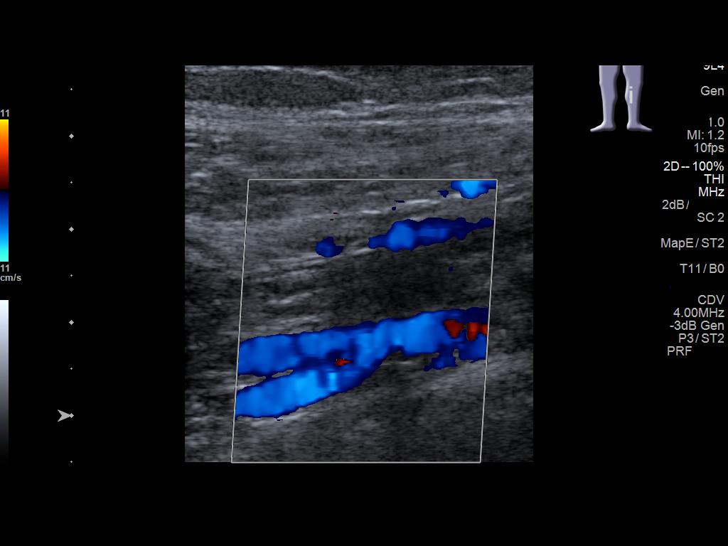

[13 of 24 positions shown; findings below may reference images not displayed]

FINDINGS: RIGHT LOWER EXTREMITY

Common Femoral Vein: No evidence of thrombus. Normal
compressibility, respiratory phasicity and response to augmentation.

Saphenofemoral Junction: No evidence of thrombus. Normal
compressibility and flow on color Doppler imaging.

Profunda Femoral Vein: No evidence of thrombus. Normal
compressibility and flow on color Doppler imaging.

Femoral Vein: Nonocclusive thrombus identified in the proximal and
mid femoral vein becomes occlusive in the distal femoral vein.

Popliteal Vein: Positive for DVT.

Calf Veins: Positive for DVT.

Superficial Great Saphenous Vein: No evidence of thrombus. Normal
compressibility and flow on color Doppler imaging.

LEFT LOWER EXTREMITY

Common Femoral Vein: No evidence of thrombus. Normal
compressibility, respiratory phasicity and response to augmentation.

Saphenofemoral Junction: No evidence of thrombus. Normal
compressibility and flow on color Doppler imaging.

Profunda Femoral Vein: No evidence of thrombus. Normal
compressibility and flow on color Doppler imaging.

Femoral Vein: No evidence of thrombus. Normal compressibility,
respiratory phasicity and response to augmentation.

Popliteal Vein: No evidence of thrombus. Normal compressibility,
respiratory phasicity and response to augmentation.

Calf Veins: No evidence of thrombus. Normal compressibility and flow
on color Doppler imaging.

Superficial Great Saphenous Vein: No evidence of thrombus. Normal
compressibility and flow on color Doppler imaging.
IMPRESSION: Nonocclusive thrombus identified in the proximal and mid right
femoral vein becomes occlusive in the distal femoral vein and
popliteal vein.

Critical Value/emergent results were called by telephone at the time
of interpretation on 07/26/2016 at [DATE] to Dr. FERIENHAUS ERXLEBEN , who
verbally acknowledged these results.

## 2016-11-02 ENCOUNTER — Telehealth: Payer: Self-pay | Admitting: Internal Medicine

## 2016-11-02 MED ORDER — SCOPOLAMINE 1 MG/3DAYS TD PT72: 1.0000 | MEDICATED_PATCH | TRANSDERMAL | 1 refills | Status: AC

## 2016-11-02 NOTE — Telephone Encounter (Signed)
Virgie called from [place] regarding pt needing Scoplamine patch due to her being at dying process. Please call Virgie @ [phone].  [id number] [place], [place] Thank you!

## 2016-11-02 NOTE — Telephone Encounter (Signed)
[facility] [address] [facility]

## 2016-11-02 NOTE — Telephone Encounter (Signed)
Please advise 

## 2016-11-15 ENCOUNTER — Telehealth: Payer: Self-pay | Admitting: Internal Medicine

## 2016-11-15 NOTE — Telephone Encounter (Signed)
Vergie from [facility] called and stated that they had to do an in and out Cath on the pt due to her not urinating in over 24 hours. They would like to know if Dr. Derrel Nip could put in an order for a Ua. Please advise, thank you!  Call Vergie @ [phone]

## 2016-11-15 NOTE — Telephone Encounter (Signed)
Please clarify request, (see below) because  If patient is actively dying,  We do not need a UA.  If they need an order for in and out cath,  You can give them that

## 2016-11-15 NOTE — Telephone Encounter (Signed)
Verbal given for UA for comfort measure for patient and family.

## 2016-11-16 ENCOUNTER — Telehealth: Payer: Self-pay | Admitting: Internal Medicine

## 2016-11-16 NOTE — Telephone Encounter (Signed)
I received the UA .  The results are not conclusive for infection, so I will wait for the Urine culture.

## 2016-11-19 MED ORDER — CIPROFLOXACIN HCL 250 MG PO TABS: 250.0000 mg | ORAL_TABLET | Freq: Two times a day (BID) | ORAL | 0 refills | Status: AC

## 2016-11-19 NOTE — Telephone Encounter (Signed)
Culture results in yellow folder standing up,  Enterococcus faecalis > 100,000.   Sensitive to the following  Ciprofloxacin Levofloxacin Nitrofurantoin  penicillin Vancomycin  Resistant  Tetracyclines

## 2016-11-19 NOTE — Telephone Encounter (Signed)
Left message for hospice nurse to return call to office.

## 2016-11-19 NOTE — Telephone Encounter (Signed)
rx cipro,  Will print rx ,  Not sure where to send sicne Hospice is managing

## 2016-11-19 NOTE — Telephone Encounter (Signed)
Vergie from Hospice called back returnig your call. Thank you!  Call @ [phone]

## 2016-11-20 ENCOUNTER — Telehealth: Payer: Self-pay | Admitting: Internal Medicine

## 2016-11-20 NOTE — Telephone Encounter (Signed)
Left detailed message for hospice and faxed script to patient pharmacy.

## 2016-11-20 NOTE — Telephone Encounter (Signed)
Pt spouse called and stated that pt has a hard time swallowing pills and spoke with a doctor friend that said that there is a liquid form. They would like that called in. Please advise, thank you!  [id number] [place], [place]  Call [place] @ [phone]

## 2016-11-23 ENCOUNTER — Encounter: Payer: Self-pay | Admitting: Internal Medicine

## 2016-11-23 ENCOUNTER — Telehealth: Payer: Self-pay | Admitting: Internal Medicine

## 2016-11-23 NOTE — Telephone Encounter (Signed)
Pt's Death Certificate is up front in Dr. Lupita Dawn color folder to be signed.

## 2016-11-23 NOTE — Telephone Encounter (Signed)
Death Certificate in [place] folder to be sign

## 2016-11-23 NOTE — Telephone Encounter (Signed)
Antibiotic was received and given Patient passed on Dec 13, 2016 at 2.30 am. Talked with Hospice nurse Darcus Pester.

## 2016-11-23 NOTE — Telephone Encounter (Deleted)
We need liquid Cipro due to patient having swallow ing difficulty.

## 2016-12-06 DEATH — deceased

## 2016-12-10 ENCOUNTER — Encounter: Payer: Self-pay | Admitting: Internal Medicine

## 2017-07-31 NOTE — Telephone Encounter (Signed)
Error

## 2019-10-15 ENCOUNTER — Telehealth: Payer: Self-pay | Admitting: Internal Medicine

## 2019-10-15 NOTE — Telephone Encounter (Signed)
Error
